# Patient Record
Sex: Female | Born: 1965 | Race: White | Hispanic: No | Smoking: Current every day smoker
Health system: Southern US, Community
[De-identification: ages and names within clinical notes are randomized; demographics above are authoritative.]

## PROBLEM LIST (undated history)

## (undated) DIAGNOSIS — Z5189 Encounter for other specified aftercare: Secondary | ICD-10-CM

## (undated) DIAGNOSIS — Z87440 Personal history of urinary (tract) infections: Secondary | ICD-10-CM

## (undated) DIAGNOSIS — Z8619 Personal history of other infectious and parasitic diseases: Secondary | ICD-10-CM

## (undated) DIAGNOSIS — K219 Gastro-esophageal reflux disease without esophagitis: Secondary | ICD-10-CM

## (undated) DIAGNOSIS — R51 Headache: Secondary | ICD-10-CM

## (undated) DIAGNOSIS — R519 Headache, unspecified: Secondary | ICD-10-CM

## (undated) DIAGNOSIS — I10 Essential (primary) hypertension: Secondary | ICD-10-CM

## (undated) HISTORY — DX: Personal history of urinary (tract) infections: Z87.440

## (undated) HISTORY — DX: Headache, unspecified: R51.9

## (undated) HISTORY — DX: Gastro-esophageal reflux disease without esophagitis: K21.9

## (undated) HISTORY — DX: Headache: R51

## (undated) HISTORY — DX: Encounter for other specified aftercare: Z51.89

## (undated) HISTORY — DX: Personal history of other infectious and parasitic diseases: Z86.19

---
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
## 1992-07-31 HISTORY — PX: CHOLECYSTECTOMY: SHX55

## 1998-12-07 ENCOUNTER — Other Ambulatory Visit: Admission: RE | Admit: 1998-12-07 | Discharge: 1998-12-07 | Payer: Self-pay | Admitting: Obstetrics & Gynecology

## 1999-01-03 ENCOUNTER — Encounter: Payer: Self-pay | Admitting: Internal Medicine

## 1999-01-03 ENCOUNTER — Ambulatory Visit (HOSPITAL_COMMUNITY): Admission: RE | Admit: 1999-01-03 | Discharge: 1999-01-03 | Payer: Self-pay | Admitting: Internal Medicine

## 2000-05-17 ENCOUNTER — Other Ambulatory Visit: Admission: RE | Admit: 2000-05-17 | Discharge: 2000-05-17 | Payer: Self-pay | Admitting: Obstetrics and Gynecology

## 2000-06-06 ENCOUNTER — Ambulatory Visit (HOSPITAL_COMMUNITY): Admission: RE | Admit: 2000-06-06 | Discharge: 2000-06-06 | Payer: Self-pay | Admitting: Gastroenterology

## 2000-11-20 ENCOUNTER — Encounter: Payer: Self-pay | Admitting: Gastroenterology

## 2000-11-20 ENCOUNTER — Encounter: Admission: RE | Admit: 2000-11-20 | Discharge: 2000-11-20 | Payer: Self-pay | Admitting: Gastroenterology

## 2000-12-13 ENCOUNTER — Encounter: Payer: Self-pay | Admitting: Obstetrics and Gynecology

## 2000-12-13 ENCOUNTER — Encounter (INDEPENDENT_AMBULATORY_CARE_PROVIDER_SITE_OTHER): Payer: Self-pay | Admitting: Specialist

## 2000-12-13 ENCOUNTER — Ambulatory Visit (HOSPITAL_COMMUNITY): Admission: RE | Admit: 2000-12-13 | Discharge: 2000-12-13 | Payer: Self-pay | Admitting: Obstetrics and Gynecology

## 2002-12-22 ENCOUNTER — Other Ambulatory Visit: Admission: RE | Admit: 2002-12-22 | Discharge: 2002-12-22 | Payer: Self-pay | Admitting: Obstetrics and Gynecology

## 2002-12-24 ENCOUNTER — Ambulatory Visit (HOSPITAL_COMMUNITY): Admission: RE | Admit: 2002-12-24 | Discharge: 2002-12-24 | Payer: Self-pay | Admitting: Obstetrics and Gynecology

## 2002-12-24 ENCOUNTER — Encounter: Payer: Self-pay | Admitting: Obstetrics and Gynecology

## 2004-08-05 ENCOUNTER — Ambulatory Visit: Payer: Self-pay | Admitting: Internal Medicine

## 2005-03-14 ENCOUNTER — Ambulatory Visit: Payer: Self-pay | Admitting: Internal Medicine

## 2006-01-09 ENCOUNTER — Ambulatory Visit: Payer: Self-pay | Admitting: Internal Medicine

## 2006-04-17 ENCOUNTER — Ambulatory Visit: Payer: Self-pay | Admitting: Internal Medicine

## 2006-05-29 ENCOUNTER — Ambulatory Visit: Payer: Self-pay | Admitting: Internal Medicine

## 2007-01-28 ENCOUNTER — Ambulatory Visit: Payer: Self-pay | Admitting: Internal Medicine

## 2007-01-28 LAB — CONVERTED CEMR LAB
ALT: 15 units/L (ref 0–35)
AST: 18 units/L (ref 0–37)
Albumin: 3.6 g/dL (ref 3.5–5.2)
Alkaline Phosphatase: 55 units/L (ref 39–117)
BUN: 11 mg/dL (ref 6–23)
Basophils Absolute: 0 10*3/uL (ref 0.0–0.1)
Basophils Relative: 0.4 % (ref 0.0–1.0)
Bilirubin, Direct: 0.1 mg/dL (ref 0.0–0.3)
CO2: 30 meq/L (ref 19–32)
Calcium: 9 mg/dL (ref 8.4–10.5)
Chloride: 108 meq/L (ref 96–112)
Cholesterol: 171 mg/dL (ref 0–200)
Creatinine, Ser: 0.6 mg/dL (ref 0.4–1.2)
Eosinophils Absolute: 0.2 10*3/uL (ref 0.0–0.6)
Eosinophils Relative: 2.3 % (ref 0.0–5.0)
GFR calc Af Amer: 140 mL/min
GFR calc non Af Amer: 116 mL/min
Glucose, Bld: 114 mg/dL — ABNORMAL HIGH (ref 70–99)
HCT: 34.2 % — ABNORMAL LOW (ref 36.0–46.0)
HDL: 25 mg/dL — ABNORMAL LOW (ref >39.0)
Hemoglobin: 11.1 g/dL — ABNORMAL LOW (ref 12.0–15.0)
LDL Cholesterol: 116 mg/dL — ABNORMAL HIGH (ref 0–99)
Lymphocytes Relative: 25.8 % (ref 12.0–46.0)
MCHC: 32.4 g/dL (ref 30.0–36.0)
MCV: 68.3 fL — ABNORMAL LOW (ref 78.0–100.0)
Monocytes Absolute: 0.6 10*3/uL (ref 0.2–0.7)
Monocytes Relative: 6.9 % (ref 3.0–11.0)
Neutro Abs: 6 10*3/uL (ref 1.4–7.7)
Neutrophils Relative %: 64.6 % (ref 43.0–77.0)
Platelets: 302 10*3/uL (ref 150–400)
Potassium: 4.2 meq/L (ref 3.5–5.1)
RBC: 5.01 M/uL (ref 3.87–5.11)
RDW: 18.1 % — ABNORMAL HIGH (ref 11.5–14.6)
Sodium: 142 meq/L (ref 135–145)
TSH: 2.28 microintl units/mL (ref 0.35–5.50)
Total Bilirubin: 0.5 mg/dL (ref 0.3–1.2)
Total CHOL/HDL Ratio: 6.8
Total Protein: 7.1 g/dL (ref 6.0–8.3)
Triglycerides: 148 mg/dL (ref 0–149)
VLDL: 30 mg/dL (ref 0–40)
WBC: 9.2 10*3/uL (ref 4.5–10.5)

## 2007-02-12 ENCOUNTER — Other Ambulatory Visit: Admission: RE | Admit: 2007-02-12 | Discharge: 2007-02-12 | Payer: Self-pay | Admitting: Internal Medicine

## 2007-02-12 ENCOUNTER — Ambulatory Visit: Payer: Self-pay | Admitting: Internal Medicine

## 2007-02-12 ENCOUNTER — Encounter: Payer: Self-pay | Admitting: Internal Medicine

## 2007-02-21 ENCOUNTER — Telehealth: Payer: Self-pay | Admitting: *Deleted

## 2007-03-27 ENCOUNTER — Encounter: Payer: Self-pay | Admitting: Internal Medicine

## 2007-04-11 DIAGNOSIS — R51 Headache: Secondary | ICD-10-CM

## 2007-04-11 DIAGNOSIS — K219 Gastro-esophageal reflux disease without esophagitis: Secondary | ICD-10-CM | POA: Insufficient documentation

## 2007-04-11 DIAGNOSIS — J309 Allergic rhinitis, unspecified: Secondary | ICD-10-CM | POA: Insufficient documentation

## 2007-04-11 DIAGNOSIS — R519 Headache, unspecified: Secondary | ICD-10-CM | POA: Insufficient documentation

## 2007-07-15 ENCOUNTER — Telehealth: Payer: Self-pay | Admitting: Internal Medicine

## 2007-07-16 ENCOUNTER — Telehealth: Payer: Self-pay | Admitting: Internal Medicine

## 2008-03-06 ENCOUNTER — Telehealth: Payer: Self-pay | Admitting: *Deleted

## 2008-04-16 ENCOUNTER — Ambulatory Visit: Payer: Self-pay | Admitting: Internal Medicine

## 2008-04-16 DIAGNOSIS — N809 Endometriosis, unspecified: Secondary | ICD-10-CM | POA: Insufficient documentation

## 2008-04-16 DIAGNOSIS — F17200 Nicotine dependence, unspecified, uncomplicated: Secondary | ICD-10-CM

## 2008-04-16 DIAGNOSIS — E282 Polycystic ovarian syndrome: Secondary | ICD-10-CM

## 2008-04-16 DIAGNOSIS — F4321 Adjustment disorder with depressed mood: Secondary | ICD-10-CM

## 2008-04-16 DIAGNOSIS — R5383 Other fatigue: Secondary | ICD-10-CM

## 2008-04-16 DIAGNOSIS — R55 Syncope and collapse: Secondary | ICD-10-CM

## 2008-04-16 DIAGNOSIS — R635 Abnormal weight gain: Secondary | ICD-10-CM

## 2008-04-16 DIAGNOSIS — R5381 Other malaise: Secondary | ICD-10-CM

## 2008-04-16 LAB — CONVERTED CEMR LAB: Vit D, 1,25-Dihydroxy: 10 — ABNORMAL LOW (ref 30–89)

## 2008-04-20 LAB — CONVERTED CEMR LAB
ALT: 35 units/L (ref 0–35)
AST: 35 units/L (ref 0–37)
Basophils Relative: 0.5 % (ref 0.0–3.0)
Bilirubin, Direct: 0.1 mg/dL (ref 0.0–0.3)
CO2: 28 meq/L (ref 19–32)
Calcium: 9.1 mg/dL (ref 8.4–10.5)
Creatinine, Ser: 0.6 mg/dL (ref 0.4–1.2)
Glucose, Bld: 100 mg/dL — ABNORMAL HIGH (ref 70–99)
Hemoglobin: 11.1 g/dL — ABNORMAL LOW (ref 12.0–15.0)
LDL Cholesterol: 125 mg/dL — ABNORMAL HIGH (ref 0–99)
Lymphocytes Relative: 27.7 % (ref 12.0–46.0)
Monocytes Relative: 5.7 % (ref 3.0–12.0)
Neutro Abs: 5.1 10*3/uL (ref 1.4–7.7)
Prolactin: 14.9 ng/mL
RBC: 4.63 M/uL (ref 3.87–5.11)
Sodium: 138 meq/L (ref 135–145)
Testosterone: 62.65 ng/dL (ref 10.00–70.0)
Total CHOL/HDL Ratio: 7.3
Total Protein: 7.2 g/dL (ref 6.0–8.3)

## 2008-04-21 ENCOUNTER — Encounter: Payer: Self-pay | Admitting: Internal Medicine

## 2008-05-05 LAB — CONVERTED CEMR LAB: Volume, Urine-CORTUR: 1000 mL

## 2008-05-08 ENCOUNTER — Telehealth: Payer: Self-pay | Admitting: Internal Medicine

## 2008-07-02 ENCOUNTER — Telehealth: Payer: Self-pay | Admitting: Internal Medicine

## 2008-08-12 ENCOUNTER — Ambulatory Visit: Payer: Self-pay | Admitting: Internal Medicine

## 2008-08-12 DIAGNOSIS — J069 Acute upper respiratory infection, unspecified: Secondary | ICD-10-CM | POA: Insufficient documentation

## 2008-08-12 DIAGNOSIS — E559 Vitamin D deficiency, unspecified: Secondary | ICD-10-CM | POA: Insufficient documentation

## 2008-08-12 DIAGNOSIS — F988 Other specified behavioral and emotional disorders with onset usually occurring in childhood and adolescence: Secondary | ICD-10-CM | POA: Insufficient documentation

## 2008-11-12 ENCOUNTER — Ambulatory Visit: Payer: Self-pay | Admitting: Internal Medicine

## 2008-11-12 DIAGNOSIS — M25569 Pain in unspecified knee: Secondary | ICD-10-CM

## 2008-12-04 ENCOUNTER — Ambulatory Visit: Payer: Self-pay | Admitting: Internal Medicine

## 2008-12-04 DIAGNOSIS — R509 Fever, unspecified: Secondary | ICD-10-CM

## 2008-12-04 DIAGNOSIS — R05 Cough: Secondary | ICD-10-CM | POA: Insufficient documentation

## 2008-12-04 LAB — CONVERTED CEMR LAB
Albumin: 3.9 g/dL (ref 3.5–5.2)
BUN: 11 mg/dL (ref 6–23)
Basophils Absolute: 0.1 10*3/uL (ref 0.0–0.1)
Calcium: 9.3 mg/dL (ref 8.4–10.5)
Creatinine, Ser: 0.7 mg/dL (ref 0.4–1.2)
Eosinophils Relative: 1.3 % (ref 0.0–5.0)
GFR calc non Af Amer: 96.07 mL/min (ref >60)
Lymphocytes Relative: 20.8 % (ref 12.0–46.0)
Mono Screen: NEGATIVE
Monocytes Relative: 6.9 % (ref 3.0–12.0)
Neutrophils Relative %: 70.5 % (ref 43.0–77.0)
Platelets: 347 10*3/uL (ref 150.0–400.0)
Potassium: 4.2 meq/L (ref 3.5–5.1)
RDW: 18 % — ABNORMAL HIGH (ref 11.5–14.6)
Total Protein: 7.9 g/dL (ref 6.0–8.3)
WBC: 10.9 10*3/uL — ABNORMAL HIGH (ref 4.5–10.5)

## 2008-12-21 ENCOUNTER — Ambulatory Visit: Payer: Self-pay | Admitting: Cardiovascular Disease

## 2008-12-21 ENCOUNTER — Ambulatory Visit: Payer: Self-pay | Admitting: Internal Medicine

## 2008-12-21 DIAGNOSIS — J329 Chronic sinusitis, unspecified: Secondary | ICD-10-CM | POA: Insufficient documentation

## 2008-12-21 DIAGNOSIS — H6500 Acute serous otitis media, unspecified ear: Secondary | ICD-10-CM

## 2008-12-25 ENCOUNTER — Encounter: Payer: Self-pay | Admitting: Internal Medicine

## 2009-02-15 ENCOUNTER — Telehealth: Payer: Self-pay | Admitting: *Deleted

## 2009-03-30 ENCOUNTER — Ambulatory Visit: Payer: Self-pay | Admitting: Internal Medicine

## 2009-03-30 DIAGNOSIS — R1031 Right lower quadrant pain: Secondary | ICD-10-CM

## 2009-03-30 DIAGNOSIS — F438 Other reactions to severe stress: Secondary | ICD-10-CM

## 2009-03-30 DIAGNOSIS — M542 Cervicalgia: Secondary | ICD-10-CM

## 2009-03-30 LAB — CONVERTED CEMR LAB
Hep B C IgM: NEGATIVE
Hep B S Ab: NEGATIVE

## 2009-03-31 ENCOUNTER — Telehealth: Payer: Self-pay | Admitting: Internal Medicine

## 2009-04-01 LAB — CONVERTED CEMR LAB
Basophils Relative: 0 % (ref 0.0–3.0)
Eosinophils Absolute: 0.1 10*3/uL (ref 0.0–0.7)
Eosinophils Relative: 1.7 % (ref 0.0–5.0)
Free T4: 0.8 ng/dL (ref 0.6–1.6)
HCT: 33.1 % — ABNORMAL LOW (ref 36.0–46.0)
Lymphs Abs: 2.3 10*3/uL (ref 0.7–4.0)
MCHC: 32.1 g/dL (ref 30.0–36.0)
MCV: 66.2 fL — ABNORMAL LOW (ref 78.0–100.0)
Monocytes Absolute: 0.2 10*3/uL (ref 0.1–1.0)
Neutrophils Relative %: 68.3 % (ref 43.0–77.0)
Platelets: 358 10*3/uL (ref 150.0–400.0)
RBC: 5 M/uL (ref 3.87–5.11)
TSH: 1.39 microintl units/mL (ref 0.35–5.50)
WBC: 8.2 10*3/uL (ref 4.5–10.5)

## 2009-04-07 ENCOUNTER — Encounter: Payer: Self-pay | Admitting: Internal Medicine

## 2009-05-05 ENCOUNTER — Telehealth: Payer: Self-pay | Admitting: *Deleted

## 2009-07-07 ENCOUNTER — Ambulatory Visit: Payer: Self-pay | Admitting: Internal Medicine

## 2009-07-07 DIAGNOSIS — T50995A Adverse effect of other drugs, medicaments and biological substances, initial encounter: Secondary | ICD-10-CM

## 2009-07-07 DIAGNOSIS — E785 Hyperlipidemia, unspecified: Secondary | ICD-10-CM

## 2009-07-07 DIAGNOSIS — R002 Palpitations: Secondary | ICD-10-CM

## 2009-07-07 DIAGNOSIS — D649 Anemia, unspecified: Secondary | ICD-10-CM | POA: Insufficient documentation

## 2009-07-07 DIAGNOSIS — G479 Sleep disorder, unspecified: Secondary | ICD-10-CM | POA: Insufficient documentation

## 2009-07-07 LAB — CONVERTED CEMR LAB
CO2: 24 meq/L (ref 19–32)
Chloride: 108 meq/L (ref 96–112)
Cholesterol: 148 mg/dL (ref 0–200)
Creatinine, Ser: 0.5 mg/dL (ref 0.4–1.2)
HDL: 29.4 mg/dL — ABNORMAL LOW (ref >39.00)
LDL Cholesterol: 96 mg/dL (ref 0–99)
Potassium: 4 meq/L (ref 3.5–5.1)
Tissue Transglutaminase Ab, IgA: 1.1 units (ref <7)
Total CHOL/HDL Ratio: 5
Triglycerides: 113 mg/dL (ref 0.0–149.0)
VLDL: 22.6 mg/dL (ref 0.0–40.0)

## 2009-07-13 LAB — CONVERTED CEMR LAB
Basophils Absolute: 0 10*3/uL (ref 0.0–0.1)
Eosinophils Absolute: 0.3 10*3/uL (ref 0.0–0.7)
Ferritin: 2.2 ng/mL — ABNORMAL LOW (ref 10.0–291.0)
Hemoglobin: 9.8 g/dL — ABNORMAL LOW (ref 12.0–15.0)
Lymphocytes Relative: 28.1 % (ref 12.0–46.0)
MCHC: 31.9 g/dL (ref 30.0–36.0)
Monocytes Relative: 7.5 % (ref 3.0–12.0)
Neutro Abs: 4.7 10*3/uL (ref 1.4–7.7)
Neutrophils Relative %: 60.3 % (ref 43.0–77.0)
Platelets: 249 10*3/uL (ref 150.0–400.0)
RDW: 18.5 % — ABNORMAL HIGH (ref 11.5–14.6)
Saturation Ratios: 3.4 % — ABNORMAL LOW (ref 20.0–50.0)

## 2009-12-09 ENCOUNTER — Telehealth: Payer: Self-pay | Admitting: Internal Medicine

## 2010-03-28 ENCOUNTER — Ambulatory Visit: Payer: Self-pay | Admitting: Internal Medicine

## 2010-03-28 DIAGNOSIS — R609 Edema, unspecified: Secondary | ICD-10-CM

## 2010-03-28 DIAGNOSIS — R030 Elevated blood-pressure reading, without diagnosis of hypertension: Secondary | ICD-10-CM

## 2010-03-29 ENCOUNTER — Telehealth: Payer: Self-pay | Admitting: *Deleted

## 2010-08-20 ENCOUNTER — Encounter: Payer: Self-pay | Admitting: Gastroenterology

## 2010-08-28 LAB — CONVERTED CEMR LAB
Albumin: 3.8 g/dL (ref 3.5–5.2)
BUN: 8 mg/dL (ref 6–23)
Basophils Absolute: 0 10*3/uL (ref 0.0–0.1)
CO2: 26 meq/L (ref 19–32)
Direct LDL: 130.5 mg/dL
Eosinophils Absolute: 0.1 10*3/uL (ref 0.0–0.7)
Free T4: 0.64 ng/dL (ref 0.60–1.60)
Glucose, Bld: 87 mg/dL (ref 70–99)
Glucose, Urine, Semiquant: NEGATIVE
HCT: 32.1 % — ABNORMAL LOW (ref 36.0–46.0)
Hemoglobin: 10 g/dL — ABNORMAL LOW (ref 12.0–15.0)
Ketones, urine, test strip: NEGATIVE
Lymphs Abs: 2.3 10*3/uL (ref 0.7–4.0)
MCHC: 31.3 g/dL (ref 30.0–36.0)
Monocytes Absolute: 0.5 10*3/uL (ref 0.1–1.0)
Neutro Abs: 5.4 10*3/uL (ref 1.4–7.7)
Nitrite: NEGATIVE
Potassium: 4.4 meq/L (ref 3.5–5.1)
RDW: 19.1 % — ABNORMAL HIGH (ref 11.5–14.6)
Sodium: 139 meq/L (ref 135–145)
Specific Gravity, Urine: 1.015
TSH: 1.05 microintl units/mL (ref 0.35–5.50)
Triglycerides: 207 mg/dL — ABNORMAL HIGH (ref 0.0–149.0)
WBC Urine, dipstick: NEGATIVE
pH: 5.5

## 2010-08-30 NOTE — Progress Notes (Signed)
Summary: Pt req refill of Ketorolac 10mg .   Phone Note Refill Request Call back at Home Phone [phone] Message from:  Patient on March 29, 2010 11:10 AM  Refills Requested: Medication #1:  KETOROLAC TROMETHAMINE 10 MG  TABS Take 2 at onset of headache then one every 4 to 6 hours as needed **max 4 per 24 hours**   Dosage confirmed as above?Dosage Confirmed Pt having severe migraine headaches. Pls refill the Ketorolac 10mg .     Method Requested: Telephone to [facility] [phone] Initial call taken by: Lucy Antigua,  March 29, 2010 11:10 AM  Follow-up for Phone Call        pt call back concerning status of rx Follow-up by: Heron Sabins,  March 29, 2010 4:09 PM  Additional Follow-up for Phone Call Additional follow up Details #1::        please refill her medicine x 2  Additional Follow-up by: Madelin Headings MD,  March 29, 2010 5:45 PM    New/Updated Medications: KETOROLAC TROMETHAMINE 10 MG  TABS (KETOROLAC TROMETHAMINE) Take 2 at onset of headache then one every 4 to 6 hours as needed **max 4 per 24 hours** Prescriptions: KETOROLAC TROMETHAMINE 10 MG  TABS (KETOROLAC TROMETHAMINE) Take 2 at onset of headache then one every 4 to 6 hours as needed **max 4 per 24 hours**  #20 x 1   Entered by:   Romualdo Bolk, CMA (AAMA)   Authorized by:   Madelin Headings MD   Signed by:   Romualdo Bolk, CMA (AAMA) on 03/29/2010   Method used:   Electronically to        [facility]  [address] (retail)       [address]       [place], [place]  [address]       Ph: [phone]       Fax: [phone]   RxID:   [id number]

## 2010-08-30 NOTE — Progress Notes (Signed)
Summary: rx for sinus  Phone Note Call from Patient   Caller: Patient Call For: Madelin Headings MD Reason for Call: Acute Illness Summary of Call: Pt is asking Dr. Fabian Sharp for an antibiotic to be called to [facility]) for sinus infection, c/o sinus headache, congestion (green mucus), cough, left ear pain, x 2 weeks.  No fever. [phone] Initial call taken by: Lynann Beaver CMA,  Dec 09, 2009 10:42 AM  Follow-up for Phone Call        Per Dr. Fabian Sharp- needs ov to evaluate. Can use SDA tomorrow. Follow-up by: Romualdo Bolk, CMA Duncan Dull),  Dec 09, 2009 11:27 AM  Additional Follow-up for Phone Call Additional follow up Details #1::        Pt. notified.  Did not schedule appt due to finances. Additional Follow-up by: Lynann Beaver CMA,  Dec 09, 2009 11:30 AM

## 2010-08-30 NOTE — Assessment & Plan Note (Signed)
Summary: follow up on meds/ssc   Vital Signs:  Patient profile:   44 year old female Menstrual status:  regular LMP:     03/03/2010 Height:      69 inches Weight:      275 pounds BMI:     40.76 Pulse rate:   88 / minute BP sitting:   150 / 80  (left arm) Cuff size:   large  Vitals Entered By: Romualdo Bolk, CMA (AAMA) (March 28, 2010 2:05 PM)  CC: Follow-up visit on meds, pt is also having problems with her legs swelling while traveling. This has happened twice before. She has also had a ha for 4 days. Pt is also still complaining of rt sided pain that has gotten worse with a burning sensation. LMP (date): 03/03/2010 LMP - Character: heavy Menarche (age onset years): 14   Menses interval (days): 28 Menstrual flow (days): 5 Enter LMP: 03/03/2010   History of Present Illness: Joy Cooley  comes in today  for follow up of labs and anemia    GERD  helping with  prilosec . Headaches : Getting has near cycle.    last refill  november # 40. denies se of  short doarse toradol  no gi bleeding  EDEMA:   noted left    leg swelling on train to [place]   and then when traveled to [place]   same thing.    NO pain redness or formness or chaning in ability to walk.   no cp some palpitations she has had in the past  Breathing "good so far.    " still smoking  not aware of OSA signs .  Still has burning pain rlq   ? if could be a cyst  has seen gyne for this and no dx.      does have a dx of  endometriosis  . discomfort not related to bowel function.  MOOD: doing much better .   Preventive Screening-Counseling & Management  Alcohol-Tobacco     Alcohol drinks/day: <1     Smoking Status: current     Smoking Cessation Counseling: yes     Smoke Cessation Stage: contemplative     Packs/Day: 1.0  Caffeine-Diet-Exercise     Caffeine use/day: 2     Does Patient Exercise: yes     Type of exercise: walking  Current Medications (verified): 1)  Flexeril 10 Mg Tabs (Cyclobenzaprine  Hcl) .... 1/2 To 1 Three Times A Day As Needed 2)  Prilosec 20 Mg Cpdr (Omeprazole) .Marland Kitchen.. 1 By Mouth Once Daily 3)  Ketorolac Tromethamine 10 Mg  Tabs (Ketorolac Tromethamine) .... Take 2 At Onset of Headache Then One Every 4 To 6 Hours As Needed **max 4 Per 24 Hours** 4)  Promethazine Hcl 25 Mg  Tabs (Promethazine Hcl) .Marland Kitchen.. 1 By Mouth Every 4-5 Hours As Needed 5)  Vitamin D 04540 Unit Caps (Ergocalciferol) .Marland Kitchen.. 1 By Mouth Every Other Week For 12 Weeks 6)  Vitamin B Complex-C   Caps (B Complex-C) 7)  Lunesta 3 Mg Tabs (Eszopiclone) .Marland Kitchen.. 1 By Mouth  Hs As Needed 8)  Niferex-Pn Forte  Tabs (Prenatal Vit-Fe Psac Cmplx-Fa) .Marland Kitchen.. 1 By Mouth Once Daily  Allergies (verified): 1)  ! Erythromycin 2)  Xanax  Past History:  Past medical, surgical, family and social histories (including risk factors) reviewed, and no changes noted (except as noted below).  Past Medical History: Reviewed history from 11/12/2008 and no changes required. Migraine HAs Endometriosis ? PCO  Allergic rhinitis GERD Tobacco Use Headache  eval 2006-2007  using toradol as needed    Consults Dr. Russella Dar     Past Surgical History: Reviewed history from 12/21/2008 and no changes required. Caesarean section Cholecystectomy  adenoids BTL       Past History:  Care Management: Gynecology: Dr. Senaida Ores Gastroenterology: Dr. Russella Dar  Family History: Reviewed history from 12/04/2008 and no changes required. ADOPTED Son has asthma   Social History: Reviewed history from 07/07/2009 and no changes required. Occupation: trained in Chief Financial Officer   working  separated in process of divorce Current Smoker Alcohol use-no  Drug use-no Regular exercise-no    sleep   6-8 hours   Review of Systems  The patient denies anorexia, fever, weight loss, vision loss, decreased hearing, chest pain, syncope, dyspnea on exertion, prolonged cough, melena, hematochezia, severe indigestion/heartburn, hematuria, muscle weakness, difficulty  walking, abnormal bleeding, enlarged lymph nodes, and angioedema.    Physical Exam  General:  alert, well-developed, well-nourished, and well-hydrated.   Head:  normocephalic and atraumatic.   Eyes:  vision grossly intact, pupils equal, and pupils round.   Ears:  R ear normal, L ear normal, and no external deformities.   Nose:  no external deformity and no nasal discharge.   Mouth:  pharynx pink and moist.   Neck:  No deformities, masses, or tenderness noted. Lungs:  Normal respiratory effort, chest expands symmetrically. Lungs are clear to auscultation, no crackles or wheezes.no dullness.   Heart:  Normal rate and regular rhythm. S1 and S2 normal without gallop, murmur, click, rub or other extra sounds.no lifts.   Abdomen:  Bowel sounds positive,abdomen soft and non-tender without masses, organomegaly or  noted.  points to area of right pelvic lower q as area where gets discomfort  Pulses:  pulses intact without delay   Extremities:  trace left pedal edema, 1+ left pedal edema, trace right pedal edema, and 1+ right pedal edema.   no redness or local swelling  tenderness Neurologic:  alert & oriented X3, strength normal in all extremities, and gait normal.  non f ocal  Skin:  turgor normal, color normal, no ecchymoses, and no petechiae.   Cervical Nodes:  No lymphadenopathy noted Psych:  Oriented X3, normally interactive, good eye contact, not anxious appearing, and not depressed appearing.     Impression & Recommendations:  Problem # 1:  EDEMA (ICD-782.3) no evidence of  sig cv compromise and could be  related to obestiy and gravity .  no obv obstructive signs otherwise. will get labs    Orders: UA Dipstick w/o Micro (automated)  (81003) Specimen Handling (62130) T-2 View CXR (71020TC) TLB-Lipid Panel (80061-LIPID) TLB-BMP (Basic Metabolic Panel-BMET) (80048-METABOL) TLB-CBC Platelet - w/Differential (85025-CBCD) TLB-Hepatic/Liver Function Pnl (80076-HEPATIC) TLB-TSH (Thyroid  Stimulating Hormone) (84443-TSH) TLB-T4 (Thyrox), Free (86578-IO9G) EKG w/ Interpretation (93000)  Problem # 2:  PALPITATIONS, RECURRENT (ICD-785.1) ekg nsr with ns st changes   Orders: UA Dipstick w/o Micro (automated)  (81003) Specimen Handling (29528) T-2 View CXR (71020TC) TLB-Lipid Panel (80061-LIPID) TLB-BMP (Basic Metabolic Panel-BMET) (80048-METABOL) TLB-CBC Platelet - w/Differential (85025-CBCD) TLB-Hepatic/Liver Function Pnl (80076-HEPATIC) TLB-TSH (Thyroid Stimulating Hormone) (84443-TSH) TLB-T4 (Thyrox), Free [phone]) EKG w/ Interpretation (93000)  Problem # 3:  UNSPECIFIED ANEMIA (ICD-285.9)  repeat labs today   Hgb: 9.8 (07/07/2009)   Hct: 30.8 (07/07/2009)   Platelets: 249.0 (07/07/2009) RBC: 4.62 (07/07/2009)   RDW: 18.5 H % (07/07/2009)   WBC: 7.8 (07/07/2009) MCV: 66.5 L fl (07/07/2009)   MCHC: 31.9 (07/07/2009) Ferritin: 2.2 (07/07/2009) Iron:  17 (07/07/2009)   % Sat: 3.4 (07/07/2009) B12: 246 (07/07/2009)   Folate: 3.0 (07/07/2009)   TSH: 1.39 (03/30/2009)  Problem # 4:  TOBACCO USE (ICD-305.1)  Orders: T-2 View CXR (71020TC)  Problem # 5:  OBESITY, UNSPECIFIED (ICD-278.00)  Problem # 6:  GERD (ICD-530.81)  Her updated medication list for this problem includes:    Prilosec 20 Mg Cpdr (Omeprazole) .Marland Kitchen... 1 by mouth once daily  Problem # 7:  ADJUSTMENT DISORDER WITH DEPRESSED MOOD (ICD-309.0) Assessment: Improved  Problem # 8:  HEADACHE (ICD-784.0) seem homonally related .  caution with gi se poss of med.  Her updated medication list for this problem includes:    Ketorolac Tromethamine 10 Mg Tabs (Ketorolac tromethamine) .Marland Kitchen... Take 2 at onset of headache then one every 4 to 6 hours as needed **max 4 per 24 hours**  Problem # 9:  ELEVATED BLOOD PRESSURE WITHOUT DIAGNOSIS OF HYPERTENSION (ICD-796.2) patient attributes this to other factors today  may need to be on meds for control   should monitor and follow up   BP today: 150/80 Prior BP:  110/70 (07/07/2009)  Labs Reviewed: Creat: 0.5 (07/07/2009) Chol: 148 (07/07/2009)   HDL: 29.40 (07/07/2009)   LDL: 96 (07/07/2009)   TG: 113.0 (07/07/2009)  Instructed in low sodium diet (DASH Handout) and behavior modification.    Complete Medication List: 1)  Flexeril 10 Mg Tabs (Cyclobenzaprine hcl) .... 1/2 to 1 three times a day as needed 2)  Prilosec 20 Mg Cpdr (Omeprazole) .Marland Kitchen.. 1 by mouth once daily 3)  Ketorolac Tromethamine 10 Mg Tabs (Ketorolac tromethamine) .... Take 2 at onset of headache then one every 4 to 6 hours as needed **max 4 per 24 hours** 4)  Promethazine Hcl 25 Mg Tabs (Promethazine hcl) .Marland Kitchen.. 1 by mouth every 4-5 hours as needed 5)  Vitamin D 40981 Unit Caps (Ergocalciferol) .Marland Kitchen.. 1 by mouth every other week for 12 weeks 6)  Vitamin B Complex-c Caps (B complex-c) 7)  Lunesta 3 Mg Tabs (Eszopiclone) .Marland Kitchen.. 1 by mouth  hs as needed 8)  Niferex-pn Forte Tabs (Prenatal vit-fe psac cmplx-fa) .Marland Kitchen.. 1 by mouth once daily  Patient Instructions: 1)  You will be informed of lab /x ray results when available.  2)  follow up depending on results  3)  REC dc tobacco  4)  avoid salts and processed foods  5)  increase exercise  lose weight  may help . 6)  Check your  Blood Pressure regularly . If it is above: 140/90    you should make an appointment. to check this  Prescriptions: FLEXERIL 10 MG TABS (CYCLOBENZAPRINE HCL) 1/2 to 1 three times a day as needed  #90 x 2   Entered by:   Romualdo Bolk, CMA (AAMA)   Authorized by:   Madelin Headings MD   Signed by:   Romualdo Bolk, CMA (AAMA) on 03/28/2010   Method used:   Electronically to        [facility]  [address] (retail)       [address]       [place], [place]  [address]       Ph: [phone]       Fax: [phone]   RxID:   [id number]   Laboratory Results   Urine Tests  Date/Time Recieved: March 28, 2010 4:35 PM  Date/Time Reported: March 28, 2010 4:35 PM   Routine Urinalysis  Color: yellow Appearance: Clear Glucose: negative   (Normal Range: Negative) Bilirubin: negative   (Normal Range: Negative) Ketone: negative   (Normal Range: Negative) Spec. Gravity: 1.015   (Normal Range: 1.003-1.035) Blood: negative   (Normal Range: Negative) pH: 5.5   (Normal Range: 5.0-8.0) Protein: negative   (Normal Range: Negative) Urobilinogen: 0.2   (Normal Range: 0-1) Nitrite: negative   (Normal Range: Negative) Leukocyte Esterace: negative   (Normal Range: Negative)    Comments: Wynona Canes, CMA  March 28, 2010 4:35 PM

## 2010-12-16 NOTE — Op Note (Signed)
[facility]  Patient:    Joy Cooley, Joy Cooley                 MRN: [id number] Proc. Date: 12/13/00 Adm. Date:  [id number] Attending:  Oliver Pila                           Operative Report  PREOPERATIVE DIAGNOSES: 1. Pelvic pain. 2. Desires sterility. 3. History of endometriosis.  POSTOPERATIVE DIAGNOSES:  Left peritubal cyst as well as previous preoperative diagnoses listed.  PROCEDURE:  Diagnostic laparoscopy with partial left salpingectomy and removal of peritubal cyst.  Also right fulguration of fallopian tube.  SURGEON:  Alvino Chapel, M.D.  ANESTHESIA:  General.  ESTIMATED BLOOD LOSS:  Minimal.  URINE OUTPUT:  200 cc.  INTRAVENOUS FLUIDS:  Approximately 1400 cc.  FINDINGS:  There is a 3-4 cm fallopian tube cyst on the left, normal right ovary and tube, normal uterus, and there is no evidence of endometriosis. There was some scarring of the mesentery at the umbilicus.  To pathology, the distal left fallopian tube and cyst was sent.  DESCRIPTION OF PROCEDURE:  The patient was taken to the operating room where general anesthesia was obtained without difficulty.  She was then prepped and draped in the normal sterile fashion in the dorsal lithotomy position.  A speculum was then placed within the vagina and the cervix identified and grasped with a single-tooth tenaculum.  This was easily sounded, and a ZUMI syringe passed within for uterine manipulation.  After the balloon was inflated, the speculum was removed, and attention was then turned to the patients abdomen.  A small infraumbilical incision was then made with a scalpel, and the Veress needle was introduced into the abdomen.  It was quite difficult to get the Veress needle through the fascia, and it appeared that there was some scar tissue underlying.  This was accomplished after several attempts, and glass flow was applied with normal pressure noted  and pneumoperitoneum was obtained with approximately 3.5 liters of CO2 gas.  The Veress needle was then removed, and the trocar was placed within the incision. Intraperitoneal placement was then confirmed with the camera.  At this point, the glass flow was applied to the trocar, and the pelvis and abdomen were inspected with findings as previously stated.  At this point, it was noted that there was some mesentery adherent to the area of the umbilicus; however, this appeared to be more superior in location, and there was no evidence of bowel loops within this adhesion.  Inferiorly, the pelvis was clear, and there was no obstruction of view from the mesentery or bowel.  The right fallopian tube and ovary were carefully inspected and found to be normal.  The anterior and posterior cul-de-sac were both inspected and found to be normal with no evidence of endometriosis.  The patients left ovary and fallopian tube were carefully inspected.  The ovary appeared normal; however, the fallopian tube was notable enlarged with a 3-4 cm distal peritubal cyst.  An additional 5 mm trocar was placed in the patients left lower quadrant under direct visualization.  Again, it was slightly difficult to puncture through the peritoneal tissue; however, this was accomplished with no evidence of bleeding.  A blunt probe was then placed and the bowel swept away from the left adnexa.  An endoloop was then passed through this 5 mm trocar, and the operating scope utilized to grasp the distal end  of the fallopian tube and pull it through a 0 Vicryl endoloop.  This was cinched down just proximal to the peritubal cyst.  After this endoloop was trimmed away, a second endoloop was passed around the pedicle in a similar fashion.  The endoshears were then utilized to trim away the cyst, and the pedicle was inspected and found to be hemostatic.  The cyst itself was removed through the 10 mm trocar at the umbilicus and handed  off to pathology.  Again, the pedicle was inspected and found to be hemostatic; therefore, the Klepinger was introduced through the 5 mm port, and the right fallopian tube was fulgurated in three segments for a proximal 2-3 cm blanch of tube.  This was accomplished without difficulty, and again the pelvis and abdomen were inspected.  The 5 mm trocar site was inspected and found to be hemostatic.  There was no evidence of bleeding from the 11 mm trocar site at the umbilicus; however, this was slightly more difficult to visualize, as the camera was through this trocar.  Trocars and instruments were then removed from the abdomen, and the umbilicus was noted to have some bleeding.  This was palpated and appeared to be coming from the superfascial area.  After a deep suture was placed in the subcutaneous tissue, there was no further bleeding noted; therefore, the skin was closed with 3-0 Vicryl.  Sponge, lap, and needle counts were correct x 2, and the patient was then taken to the recovery room in stable condition. DD:  12/13/00 TD:  12/14/00 Job: [id number]

## 2010-12-16 NOTE — H&P (Signed)
[facility]  Patient:    Joy Cooley, Joy Cooley                   MRN: [id number] Adm. Date:  12/13/00 Attending:  Alvino Chapel, M.D.                         History and Physical  HISTORY OF PRESENT ILLNESS:  The patient is a 45 year old, G-2, P-2, who presents for a diagnostic laparoscopy and laparoscopic tubal sterilization secondary to some persistent pelvic pain and a history of endometriosis.  The patient started noting these symptoms approximately six months ago and they began increasing specifically in the right quadrant approximately one month ago.  She has had a workup from gastroenterology which is negative and a CT scan which was essentially negative, except for a small cyst on the left ovary.  Currently, the patient is on no contraceptive method, however, has recently had a menstrual cycle and will have a pregnancy test prior to procedure.  PAST MEDICAL HISTORY:  Significant for reflux disease and hiatal hernia.  PAST SURGICAL HISTORY:  She had a cholecystectomy in 1994.  She had a laparoscopy in 1990 associated with her abdominal and pelvic pain which revealed endometriosis.  That was performed in [place], [place].  ALLERGIES:  ERYTHROMYCIN.  CURRENT MEDICATIONS:  Nexium and Reglan.  PAST GYNECOLOGICAL HISTORY:  Significant for the history of endometriosis and some question of polycystic ovary disease.  The patient has had a normal Pap smear performed in October 2001.  PAST OBSTETRICAL HISTORY:  Significant for a cesarean section of an 8 pound 2 ounce breech infant in 1994, as well as in 1996 a normal spontaneous vaginal delivery of a 7 pound 15 ounce infant.  PHYSICAL EXAMINATION:  VITAL SIGNS:  The patient is 5 feet 10 inches in height.  Weight 230 pounds.  HEART:  Regular rate and rhythm.  LUNGS:  Clear.  ABDOMEN:  Soft and nontender.  BREASTS:  Normal, with no masses, discharge, or adenopathy noted.  PELVIC:  She  has normal external genitalia.  Cervix is without lesions. Uterus is normal size.  Adnexa without masses.  LABORATORY DATA:  Hemoglobin 15.1.  DISCUSSION:  The patient was counseled as to the risks and benefits of procedure and understands these issues, however, she wishes to proceed with a diagnostic laparoscopy for her pelvic pain with possible ablation of endometriosis as well as a laparoscopic tubal sterilization given a desire for permanent sterility.  The patient has failed previous medical therapy with Lupron and currently is not a candidate for birth control pills given her smoking status.  PLAN:  We will proceed with the diagnostic laparoscopy and sterilization as planned, with ablation of any endometriosis discovered at the time of surgery. D:  12/10/00 TD:  12/10/00 Job: [id number]

## 2010-12-16 NOTE — Cardiovascular Report (Signed)
[place]  Patient:    Joy Cooley, Joy Cooley                 MRN: [id number] Proc. Date: 06/06/00 Adm. Date:  [id number] Attending:  Charna Elizabeth CC:         Neta Mends. Panosh, M.D. [facility]                        Cardiac Catheterization  DATE OF BIRTH:  1965/10/04  REFERRING PHYSICIAN:  Neta Mends. Panosh, M.D. [facility]  PROCEDURE PERFORMED:  Esophagogastroduodenoscopy.  ENDOSCOPIST:  Anselmo Rod, M.D.  INSTRUMENT USED:  Olympus video panendoscope.  INDICATIONS FOR PROCEDURE: Abdominal pain with guaiac positive stool.  Rule out peptic ulcer disease, esophagitis, gastritis, etc.  PREPROCEDURE PREPARATION:  Informed consent was procured from the patient. The patient was fasted for eight hours prior to the procedure.  PREPROCEDURE PHYSICAL:  The patient had stable vital signs.  Neck supple. Chest clear to auscultation.  S1, S2 regular.  Abdomen soft with normal abdominal bowel sounds.  DESCRIPTION OF PROCEDURE:  The patient was placed in left lateral decubitus position and sedated with 50 mg of Demerol and 7 mg of Versed intravenously. Once the patient was adequately sedated and maintained on low-flow oxygen and continuous cardiac monitoring, the Olympus video panendoscope was advanced through the mouthpiece, over the tongue, into the esophagus under direct vision.  The entire esophagus appeared normal without evidence of ring stricture, masses, lesions, esophagitis or Barretts mucosa.  The scope was then advanced into the stomach.  The entire gastric mucosa appeared healthy without lesions.  The proximal small bowel appeared normal.  There was no outlet obstruction.  A small hiatal hernia was seen on high retroflexion.  IMPRESSION:  Normal esophagogastroduodenoscopy except for a small hiatal hernia.  RECOMMENDATION:  Proceed with colonoscopy at this time. DD:  06/06/00 TD:  06/07/00 Job: [id number]

## 2010-12-16 NOTE — Procedures (Signed)
[place]  Patient:    Joy Cooley, Joy Cooley                 MRN: [id number] Proc. Date: 06/06/00 Adm. Date:  [id number] Attending:  Charna Elizabeth CC:         Neta Mends. Panosh, M.D. [facility]   Procedure Report  DATE OF BIRTH:  [birth year]  PROCEDURE PERFORMED:  Colonoscopy.  ENDOSCOPIST:  Anselmo Rod, M.D.  INSTRUMENT USED:  Olympus video colonoscope.  INDICATION FOR PROCEDURE:  Guaiac positive stool and abdominal pain in a 45 year old white female, rule out colonic polyps, masses, hemorrhoids, etc.  PREPROCEDURE PREPARATION:  Informed consent was procured from the patient. The patient was fasted for eight hours prior to the procedure and prepped with a bottle of magnesium citrate and a gallon of NuLytely the night prior to the procedure.  PREPROCEDURE PHYSICAL:  VITAL SIGNS:  The patient had stable vital signs.  NECK:  Supple.  CHEST:  Clear to auscultation.  S1 and S2 regular.  No murmur, rub or gallop, rales, rhonchi or wheezing.  ABDOMEN: Soft with normal abdominal bowel sounds.  DESCRIPTION OF PROCEDURE:  The patient was placed in the left lateral decubitus position and sedated with an additional 50 mg of Demerol and 3 mg of Versed intravenously.  Once the patient was adequately sedated and maintained on low flow oxygen and continuous cardiac monitoring, the Olympus video colonoscope was advanced from the rectum to the cecum without difficulty.  The patient had some abdominal discomfort.  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  No erosions, ulcerations, masses or polyps were seen.  The colon appeared healthy up to the cecum and terminal ileum.  IMPRESSION:  Normal colonoscopy.  RECOMMENDATIONS: 1. The patient has been advised to increase the fluid and fiber in her diet. 2. Outpatient follow up is advised in the next two weeks. DD:  06/06/00 TD:  06/07/00 Job:  [id number]

## 2011-02-24 ENCOUNTER — Telehealth: Payer: Self-pay | Admitting: Internal Medicine

## 2011-02-24 NOTE — Telephone Encounter (Signed)
Spoke with patient.

## 2011-02-24 NOTE — Telephone Encounter (Signed)
Pt needs to know date of last tetanus shot asap today. Pt need this for job today.

## 2011-04-19 ENCOUNTER — Other Ambulatory Visit: Payer: Self-pay | Admitting: Internal Medicine

## 2011-04-19 NOTE — Telephone Encounter (Signed)
Per Dr. Fabian Sharp- call pt and see why she is using it and how often she is taking it. Plan a rov.

## 2011-04-19 NOTE — Telephone Encounter (Signed)
LOV 03/28/10 NOV none Please advise

## 2011-04-20 NOTE — Telephone Encounter (Signed)
Left message for pt to call back  °

## 2011-04-24 NOTE — Telephone Encounter (Signed)
Spoke to pt and she had to change md's due to her ins not covering [place]. She had them refill her medication. I have denied the rx from [place].

## 2011-10-02 ENCOUNTER — Other Ambulatory Visit: Payer: Self-pay | Admitting: Internal Medicine

## 2011-10-03 NOTE — Telephone Encounter (Signed)
done

## 2011-10-06 ENCOUNTER — Telehealth: Payer: Self-pay | Admitting: Internal Medicine

## 2014-07-15 HISTORY — PX: TOTAL ABDOMINAL HYSTERECTOMY W/ BILATERAL SALPINGOOPHORECTOMY: SHX83

## 2014-07-27 ENCOUNTER — Encounter: Payer: Self-pay | Admitting: Family Medicine

## 2014-08-19 ENCOUNTER — Encounter: Payer: Self-pay | Admitting: Internal Medicine

## 2014-08-19 ENCOUNTER — Ambulatory Visit (INDEPENDENT_AMBULATORY_CARE_PROVIDER_SITE_OTHER): Payer: BLUE CROSS/BLUE SHIELD | Admitting: Internal Medicine

## 2014-08-19 VITALS — BP 146/96 | Temp 98.1°F | Ht 68.5 in | Wt 266.7 lb

## 2014-08-19 DIAGNOSIS — R030 Elevated blood-pressure reading, without diagnosis of hypertension: Secondary | ICD-10-CM

## 2014-08-19 DIAGNOSIS — M62838 Other muscle spasm: Secondary | ICD-10-CM

## 2014-08-19 DIAGNOSIS — K219 Gastro-esophageal reflux disease without esophagitis: Secondary | ICD-10-CM

## 2014-08-19 DIAGNOSIS — M6248 Contracture of muscle, other site: Secondary | ICD-10-CM

## 2014-08-19 DIAGNOSIS — F17200 Nicotine dependence, unspecified, uncomplicated: Secondary | ICD-10-CM

## 2014-08-19 DIAGNOSIS — Z720 Tobacco use: Secondary | ICD-10-CM

## 2014-08-19 MED ORDER — CYCLOBENZAPRINE HCL 5 MG PO TABS: 5.0000 mg | ORAL_TABLET | Freq: Three times a day (TID) | ORAL | Status: DC | PRN

## 2014-08-19 NOTE — Progress Notes (Signed)
Pre visit review using our clinic review tool, if applicable. No additional management support is needed unless otherwise documented below in the visit note.  Chief Complaint  Patient presents with  . Establish Care    gerd neck spasm    HPI: Patient  Joy Cooley  49 y.o. comes in today for new patient reestablish  visit   She is a 49 year old smoking divorced white female who is now working for [employer] was recently had a hysterectomy and fallopian tube removal for adenomyosis and abnormal cells. She had laparoscopic procedure and then had and had open procedure after bleeding and maintain requiring a transfusion. This was in December 2015 Dr. Adah Perl in [place]. She's doing well now has a little discharge from umbilicus area but no fever. She was placed on antibiotics at some point.  scaitica is a problem  Was on  Med hormone suppression  For  bpone pain may have helped or harm needed.  Constant pain in neck. That caused  Migraines.   Worse after  Computer  use but she looks at her workstation does her exercises but does ask for some Flexeril that she uses as needed to prevent her migraines from occurring  bp is usually 125   hctz using as needed. Was used for as needed swelling in the past.  She's been taking omeprazole has a history of esophageal narrowing thinks it may be reoccurring sometimes pills will get caught in the mid upper chest but no vomiting or weight loss. She has seen Dr. Fuller Plan. In the past.  Health Maintenance  Topic Date Due  . COLONOSCOPY  09/06/2013  . INFLUENZA VACCINE  03/01/2015  . MAMMOGRAM  04/30/2016  . PAP SMEAR  05/31/2017  . TETANUS/TDAP  04/11/2018   Health Maintenance Review LIFESTYLE:  Exercise:  Not yet  Tobacco/ETS:  Decreasing   Ha stopped in past  Alcohol: gave up. Sugar beverages:coke per day.  Sleep:  At least 8  Drug use: no Bone density:  Colonoscopy:  Over 10 year  PAP: had hysterectomy  adn salpingectomy  adenomyosis and abnormal cells  MAMMO: utd    ROS:  GEN/ HEENT: No fever, significant weight changes sweats headaches vision problems hearing changes, CV/ PULM; No chest pain shortness of breath cough, syncope,edema  change in exercise tolerance. GI /GU: No adominal pain, vomiting, change in bowel habits. No blood in the stool. No significant GU symptoms. SKIN/HEME: ,no acute skin rashes suspicious lesions or bleeding. No lymphadenopathy, nodules, masses.  NEURO/ PSYCH:  No neurologic signs such as weakness numbness. No depression anxiety. IMM/ Allergy: No unusual infections.  Allergy .   REST of 12 system review negative except as per HPI   Past Medical History  Diagnosis Date  . Hx of varicella   . GERD (gastroesophageal reflux disease)   . Blood transfusion without reported diagnosis     from hysterectomy surgery  . Hx: UTI (urinary tract infection)   . Recurrent headache     neck can trigger this has done pt /flexeril    Past Surgical History  Procedure Laterality Date  . Total abdominal hysterectomy w/ bilateral salpingoophorectomy  07/15/14    has ovaries   . Cholecystectomy  1994  . Tonsillectomy  1967    Family History  Problem Relation Age of Onset  . Adopted: Yes    History   Social History  . Marital Status: Single    Spouse Name: N/A    Number of  Children: N/A  . Years of Education: N/A   Social History Main Topics  . Smoking status: Current Every Day Smoker  . Smokeless tobacco: Never Used  . Alcohol Use: No  . Drug Use: No  . Sexual Activity:    Partners: Male   Other Topics Concern  . None   Social History Narrative   8 hours of sleep per night   Lives with your boyfriend, son and her daughter when she is home from school.   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  omeprazole (PRILOSEC) 20 MG capsule Take 20 mg by mouth daily.  . cyclobenzaprine (FLEXERIL) 5 MG tablet Take 1 tablet (5 mg total) by mouth 3 (three) times daily as needed for muscle spasms. neck  . hydrochlorothiazide (HYDRODIURIL) 25 MG tablet     EXAM:  BP 146/96 mmHg  Temp(Src) 98.1 F (36.7 C) (Oral)  Ht 5' 8.5" (1.74 m)  Wt 266 lb 11.2 oz (120.974 kg)  BMI 39.96 kg/m2  Body mass index is 39.96 kg/(m^2).  Physical Exam: Vital signs reviewed WUJ:WJXB is a well-developed well-nourished alert cooperative    who appearsr stated age in no acute distress.  HEENT: normocephalic atraumatic , Eyes: PERRL EOM's full, conjunctiva clear, Nares: paten,t no deformity discharge or tenderness., Ears: no deformity EAC's clear TMs with normal landmarks. Mouth: clear OP, no lesions, edema.  Moist mucous membranes. Dentition in adequate repair. NECK: supple without masses, thyromegaly or bruits. CHEST/PULM:  Clear to auscultation and percussion breath sounds equal no wheeze , rales or rhonchi. No chest wall deformities or tenderness. CV: PMI is nondisplaced, S1 S2 no gallops, murmurs, rubs. Peripheral pulses are full without delay.No JVD .  ABDOMEN: Bowel sounds normal nontender  No guard or rebound, no hepato splenomegal no CVA tenderness.  Healed umbilicus scar no acute discharge healing low transverse scar some induration around the right but no abscess streaking or specific tenderness. Extremtities:  No clubbing cyanosis or edema, no acute joint swelling or redness no focal atrophy NEURO:  Oriented x3, cranial nerves 3-12 appear to be intact, no obvious focal weakness,gait within normal limits  SKIN: No acute rashes normal turgor, color, no bruising or petechiae. PSYCH: Oriented, good eye contact, no obvious depression anxiety, cognition and judgment appear normal. LN: no cervicaladenopathy    ASSESSMENT AND PLAN:  Discussed the following assessment and plan:  Neck muscle spasm - do  exercise ocass med with caution  Gastroesophageal reflux disease, esophagitis presence not specified - Plan: Ambulatory referral to Gastroenterology  Elevated blood pressure reading - Record and treat as appropriate patient states normal at home recently.  TOBACCO USE - advised cessation.  Patient Care Team: Burnis Medin, MD as PCP - General (Internal Medicine) Nat Math, MD as Referring Physician (Obstetrics and Gynecology) Patient Instructions   Will to  referral to dr Fuller Plan  .    About the  Esophagus . And colon screening.  Continue   Neck exercises  As per PT  Flexeril as needed with caution.  Continue stopping tobacco .    Healthy lifestyle includes : At least 150 minutes of exercise weeks  , weight at healthy levels, which is  usually   BMI 19-25. Avoid trans fats and processed foods;  Increase fresh fruits and veges to 5 servings per day. And avoid sweet beverages including tea and juice. Mediterranean diet with olive oil and nuts have been noted to be heart and brain healthy . Avoid tobacco products . Limit  alcohol to  7 per week for women and 14 servings for men.  Get adequate sleep . Wear seat belts . Don't text and drive .   Take blood pressure readings twice a day for 7- 10 days and then periodically .To ensure below 140/90   .Send in readings    Or return if elevated      Why follow it? Research shows. . Those who follow the Mediterranean diet have a reduced risk of heart disease  . The diet is associated with a reduced incidence of Parkinson's and Alzheimer's diseases . People following the diet may have longer life expectancies and lower rates of chronic diseases  . The Dietary Guidelines for Americans recommends the Mediterranean diet as an eating plan to promote health and prevent disease  What Is the Mediterranean Diet?  . Healthy eating plan based on typical foods and recipes of Mediterranean-style cooking . The diet is primarily a plant based diet;  these foods should make up a majority of meals   Starches - Plant based foods should make up a majority of meals - They are an important sources of vitamins, minerals, energy, antioxidants, and fiber - Choose whole grains, foods high in fiber and minimally processed items  - Typical grain sources include wheat, oats, barley, corn, brown rice, bulgar, farro, millet, polenta, couscous  - Various types of beans include chickpeas, lentils, fava beans, black beans, white beans   Fruits  Veggies - Large quantities of antioxidant rich fruits & veggies; 6 or more servings  - Vegetables can be eaten raw or lightly drizzled with oil and cooked  - Vegetables common to the traditional Mediterranean Diet include: artichokes, arugula, beets, broccoli, brussel sprouts, cabbage, carrots, celery, collard greens, cucumbers, eggplant, kale, leeks, lemons, lettuce, mushrooms, okra, onions, peas, peppers, potatoes, pumpkin, radishes, rutabaga, shallots, spinach, sweet potatoes, turnips, zucchini - Fruits common to the Mediterranean Diet include: apples, apricots, avocados, cherries, clementines, dates, figs, grapefruits, grapes, melons, nectarines, oranges, peaches, pears, pomegranates, strawberries, tangerines  Fats - Replace butter and margarine with healthy oils, such as olive oil, canola oil, and tahini  - Limit nuts to no more than a handful a day  - Nuts include walnuts, almonds, pecans, pistachios, pine nuts  - Limit or avoid candied, honey roasted or heavily salted nuts - Olives are central to the [employer] - can be eaten whole or used in a variety of dishes   Meats Protein - Limiting red meat: no more than a few times a month - When eating red meat: choose lean cuts and keep the portion to the size of deck of cards - Eggs: approx. 0 to 4 times a week  - Fish and lean poultry: at least 2 a week  - Healthy protein sources include, chicken, [place], lean beef, lamb - Increase intake of seafood such as  tuna, salmon, trout, mackerel, shrimp, scallops - Avoid or limit high fat processed meats such as sausage and bacon  Dairy - Include moderate amounts of low fat dairy products  - Focus on healthy dairy such as fat free yogurt, skim milk, low or reduced fat cheese - Limit dairy products higher in fat such as  whole or 2% milk, cheese, ice cream  Alcohol - Moderate amounts of red wine is ok  - No more than 5 oz daily for women (all ages) and men older than age 25  - No more than 10 oz of wine daily for men younger than 16  Other - Limit sweets and other desserts  - Use herbs and spices instead of salt to flavor foods  - Herbs and spices common to the traditional Mediterranean Diet include: basil, bay leaves, chives, cloves, cumin, fennel, garlic, lavender, marjoram, mint, oregano, parsley, pepper, rosemary, sage, savory, sumac, tarragon, thyme   It's not just a diet, it's a lifestyle:  . The Mediterranean diet includes lifestyle factors typical of those in the region  . Foods, drinks and meals are best eaten with others and savored . Daily physical activity is important for overall good health . This could be strenuous exercise like running and aerobics . This could also be more leisurely activities such as walking, housework, yard-work, or taking the stairs . Moderation is the key; a balanced and healthy diet accommodates most foods and drinks . Consider portion sizes and frequency of consumption of certain foods   Meal Ideas & Options:  . Breakfast:  o Whole wheat toast or whole wheat English muffins with peanut butter & hard boiled egg o Steel cut oats topped with apples & cinnamon and skim milk  o Fresh fruit: banana, strawberries, melon, berries, peaches  o Smoothies: strawberries, bananas, greek yogurt, peanut butter o Low fat greek yogurt with blueberries and granola  o Egg white omelet with spinach and mushrooms o Breakfast couscous: whole wheat couscous, apricots, skim milk,  cranberries  . Sandwiches:  o Hummus and grilled vegetables (peppers, zucchini, squash) on whole wheat bread   o Grilled chicken on whole wheat pita with lettuce, tomatoes, cucumbers or tzatziki  o Tuna salad on whole wheat bread: tuna salad made with greek yogurt, olives, red peppers, capers, green onions o Garlic rosemary lamb pita: lamb sauted with garlic, rosemary, salt & pepper; add lettuce, cucumber, greek yogurt to pita - flavor with lemon juice and black pepper  . Seafood:  o Mediterranean grilled salmon, seasoned with garlic, basil, parsley, lemon juice and black pepper o Shrimp, lemon, and spinach whole-grain pasta salad made with low fat greek yogurt  o Seared scallops with lemon orzo  o Seared tuna steaks seasoned salt, pepper, coriander topped with tomato mixture of olives, tomatoes, olive oil, minced garlic, parsley, green onions and cappers  . Meats:  o Herbed greek chicken salad with kalamata olives, cucumber, feta  o Red bell peppers stuffed with spinach, bulgur, lean ground beef (or lentils) & topped with feta   o Kebabs: skewers of chicken, tomatoes, onions, zucchini, squash  o [place] burgers: made with red onions, mint, dill, lemon juice, feta cheese topped with roasted red peppers . Vegetarian o Cucumber salad: cucumbers, artichoke hearts, celery, red onion, feta cheese, tossed in olive oil & lemon juice  o Hummus and whole grain pita points with a greek salad (lettuce, tomato, feta, olives, cucumbers, red onion) o Lentil soup with celery, carrots made with vegetable broth, garlic, salt and pepper  o Tabouli salad: parsley, bulgur, mint, scallions, cucumbers, tomato, radishes, lemon juice, olive oil, salt and pepper.            Standley Brooking. Panosh M.D.

## 2014-08-19 NOTE — Patient Instructions (Addendum)
Will to  referral to dr Fuller Plan  .    About the  Esophagus . And colon screening.  Continue   Neck exercises  As per PT  Flexeril as needed with caution.  Continue stopping tobacco .    Healthy lifestyle includes : At least 150 minutes of exercise weeks  , weight at healthy levels, which is usually   BMI 19-25. Avoid trans fats and processed foods;  Increase fresh fruits and veges to 5 servings per day. And avoid sweet beverages including tea and juice. Mediterranean diet with olive oil and nuts have been noted to be heart and brain healthy . Avoid tobacco products . Limit  alcohol to  7 per week for women and 14 servings for men.  Get adequate sleep . Wear seat belts . Don't text and drive .   Take blood pressure readings twice a day for 7- 10 days and then periodically .To ensure below 140/90   .Send in readings    Or return if elevated      Why follow it? Research shows. . Those who follow the Mediterranean diet have a reduced risk of heart disease  . The diet is associated with a reduced incidence of Parkinson's and Alzheimer's diseases . People following the diet may have longer life expectancies and lower rates of chronic diseases  . The Dietary Guidelines for Americans recommends the Mediterranean diet as an eating plan to promote health and prevent disease  What Is the Mediterranean Diet?  . Healthy eating plan based on typical foods and recipes of Mediterranean-style cooking . The diet is primarily a plant based diet; these foods should make up a majority of meals   Starches - Plant based foods should make up a majority of meals - They are an important sources of vitamins, minerals, energy, antioxidants, and fiber - Choose whole grains, foods high in fiber and minimally processed items  - Typical grain sources include wheat, oats, barley, corn, brown rice, bulgar, farro, millet, polenta, couscous  - Various types of beans include chickpeas, lentils, fava beans, black beans,  white beans   Fruits  Veggies - Large quantities of antioxidant rich fruits & veggies; 6 or more servings  - Vegetables can be eaten raw or lightly drizzled with oil and cooked  - Vegetables common to the traditional Mediterranean Diet include: artichokes, arugula, beets, broccoli, brussel sprouts, cabbage, carrots, celery, collard greens, cucumbers, eggplant, kale, leeks, lemons, lettuce, mushrooms, okra, onions, peas, peppers, potatoes, pumpkin, radishes, rutabaga, shallots, spinach, sweet potatoes, turnips, zucchini - Fruits common to the Mediterranean Diet include: apples, apricots, avocados, cherries, clementines, dates, figs, grapefruits, grapes, melons, nectarines, oranges, peaches, pears, pomegranates, strawberries, tangerines  Fats - Replace butter and margarine with healthy oils, such as olive oil, canola oil, and tahini  - Limit nuts to no more than a handful a day  - Nuts include walnuts, almonds, pecans, pistachios, pine nuts  - Limit or avoid candied, honey roasted or heavily salted nuts - Olives are central to the [employer] - can be eaten whole or used in a variety of dishes   Meats Protein - Limiting red meat: no more than a few times a month - When eating red meat: choose lean cuts and keep the portion to the size of deck of cards - Eggs: approx. 0 to 4 times a week  - Fish and lean poultry: at least 2 a week  - Healthy protein sources include, chicken, [place], lean beef, lamb - Increase  intake of seafood such as tuna, salmon, trout, mackerel, shrimp, scallops - Avoid or limit high fat processed meats such as sausage and bacon  Dairy - Include moderate amounts of low fat dairy products  - Focus on healthy dairy such as fat free yogurt, skim milk, low or reduced fat cheese - Limit dairy products higher in fat such as whole or 2% milk, cheese, ice cream  Alcohol - Moderate amounts of red wine is ok  - No more than 5 oz daily for women (all ages) and men older than age 82   - No more than 10 oz of wine daily for men younger than 61  Other - Limit sweets and other desserts  - Use herbs and spices instead of salt to flavor foods  - Herbs and spices common to the traditional Mediterranean Diet include: basil, bay leaves, chives, cloves, cumin, fennel, garlic, lavender, marjoram, mint, oregano, parsley, pepper, rosemary, sage, savory, sumac, tarragon, thyme   It's not just a diet, it's a lifestyle:  . The Mediterranean diet includes lifestyle factors typical of those in the region  . Foods, drinks and meals are best eaten with others and savored . Daily physical activity is important for overall good health . This could be strenuous exercise like running and aerobics . This could also be more leisurely activities such as walking, housework, yard-work, or taking the stairs . Moderation is the key; a balanced and healthy diet accommodates most foods and drinks . Consider portion sizes and frequency of consumption of certain foods   Meal Ideas & Options:  . Breakfast:  o Whole wheat toast or whole wheat English muffins with peanut butter & hard boiled egg o Steel cut oats topped with apples & cinnamon and skim milk  o Fresh fruit: banana, strawberries, melon, berries, peaches  o Smoothies: strawberries, bananas, greek yogurt, peanut butter o Low fat greek yogurt with blueberries and granola  o Egg white omelet with spinach and mushrooms o Breakfast couscous: whole wheat couscous, apricots, skim milk, cranberries  . Sandwiches:  o Hummus and grilled vegetables (peppers, zucchini, squash) on whole wheat bread   o Grilled chicken on whole wheat pita with lettuce, tomatoes, cucumbers or tzatziki  o Tuna salad on whole wheat bread: tuna salad made with greek yogurt, olives, red peppers, capers, green onions o Garlic rosemary lamb pita: lamb sauted with garlic, rosemary, salt & pepper; add lettuce, cucumber, greek yogurt to pita - flavor with lemon juice and black  pepper  . Seafood:  o Mediterranean grilled salmon, seasoned with garlic, basil, parsley, lemon juice and black pepper o Shrimp, lemon, and spinach whole-grain pasta salad made with low fat greek yogurt  o Seared scallops with lemon orzo  o Seared tuna steaks seasoned salt, pepper, coriander topped with tomato mixture of olives, tomatoes, olive oil, minced garlic, parsley, green onions and cappers  . Meats:  o Herbed greek chicken salad with kalamata olives, cucumber, feta  o Red bell peppers stuffed with spinach, bulgur, lean ground beef (or lentils) & topped with feta   o Kebabs: skewers of chicken, tomatoes, onions, zucchini, squash  o [place] burgers: made with red onions, mint, dill, lemon juice, feta cheese topped with roasted red peppers . Vegetarian o Cucumber salad: cucumbers, artichoke hearts, celery, red onion, feta cheese, tossed in olive oil & lemon juice  o Hummus and whole grain pita points with a greek salad (lettuce, tomato, feta, olives, cucumbers, red onion) o Lentil soup with celery, carrots  made with vegetable broth, garlic, salt and pepper  o Tabouli salad: parsley, bulgur, mint, scallions, cucumbers, tomato, radishes, lemon juice, olive oil, salt and pepper.

## 2014-08-20 ENCOUNTER — Telehealth: Payer: Self-pay | Admitting: Internal Medicine

## 2014-08-20 NOTE — Telephone Encounter (Signed)
emmi emailed °

## 2014-08-25 ENCOUNTER — Telehealth: Payer: Self-pay | Admitting: Internal Medicine

## 2014-08-25 NOTE — Telephone Encounter (Signed)
Patient states Dr. Regis Bill wanted her to monitor her BP reading.  They are as follows:  08/24/14-  182/112 and pm 158/92   08/25/14 - 201/130

## 2014-08-26 NOTE — Telephone Encounter (Signed)
Begin lisinopril hctz  20/12.5  1 po qd  Disp 30 refill x 3  Keep getting readings in the interim   OV in 3-4 weeks with monitor to check

## 2014-08-26 NOTE — Telephone Encounter (Signed)
Left a message on home number for the pt to return my call.  No voicemail on cell.

## 2014-08-27 MED ORDER — LISINOPRIL-HYDROCHLOROTHIAZIDE 20-12.5 MG PO TABS: 1.0000 | ORAL_TABLET | Freq: Every day | ORAL | Status: DC

## 2014-08-27 NOTE — Telephone Encounter (Signed)
Called and spoke to the pt.  Informed her of new rx.  She has made a future appt for 09/21/14.  Instructed her to stop taking hctz pill because it will be combined with new rx.

## 2014-09-21 ENCOUNTER — Encounter: Payer: BLUE CROSS/BLUE SHIELD | Admitting: Internal Medicine

## 2014-09-21 NOTE — Progress Notes (Signed)
Document opened and reviewed for  visit . No showed . Needs to make appt fu for her BP monitoring in the next 2 months  Should have a wellness visit in 3-8 months

## 2014-09-25 ENCOUNTER — Telehealth: Payer: Self-pay | Admitting: Family Medicine

## 2014-09-25 NOTE — Telephone Encounter (Signed)
Spoke to the pt and she is now scheduled for 10/06/14 @ 4 PM She needed that time due to her work schedule.  Document opened and reviewed for visit . No showed . Needs to make appt fu for her BP monitoring in the next 2 months  Should have a wellness visit in 3-8 months

## 2014-10-06 ENCOUNTER — Encounter: Payer: Self-pay | Admitting: Internal Medicine

## 2014-10-06 ENCOUNTER — Encounter: Payer: Self-pay | Admitting: Family Medicine

## 2014-10-06 ENCOUNTER — Ambulatory Visit (INDEPENDENT_AMBULATORY_CARE_PROVIDER_SITE_OTHER): Payer: BLUE CROSS/BLUE SHIELD | Admitting: Internal Medicine

## 2014-10-06 VITALS — BP 120/80 | Temp 98.1°F | Ht 68.5 in | Wt 267.7 lb

## 2014-10-06 DIAGNOSIS — I10 Essential (primary) hypertension: Secondary | ICD-10-CM

## 2014-10-06 DIAGNOSIS — M25561 Pain in right knee: Secondary | ICD-10-CM

## 2014-10-06 DIAGNOSIS — K219 Gastro-esophageal reflux disease without esophagitis: Secondary | ICD-10-CM

## 2014-10-06 NOTE — Patient Instructions (Signed)
Will do ortho referral . Stay on the BP med doing well.   Will notify you  of labs when available. If ok then yearly wellness visit with labs .

## 2014-10-06 NOTE — Progress Notes (Signed)
Pre visit review using our clinic review tool, if applicable. No additional management support is needed unless otherwise documented below in the visit note.  Chief Complaint  Patient presents with  . Follow-up  . Hypertension  . Knee Pain    HPI: Joy Cooley  comes in today for follow up of  multiple medical problems.  Since her last visit she has been taking her antihypertensive medication every day had some mild fatigue but got over it blood pressure 102-128 range feels fine  GERD annoying but controlled. Right Knee killing her Right knee  remote hx of fall   ocass advil aleve.  Becoming more difficult with pain waking her in the middle the night also flares up her sciatica because she has adapted her motion. Promoted   And had to be promoted. Because of job changes some increased stress but still feels confident. Feels a lot less fatigue.  ROS: See pertinent positives and negatives per HPI.  Past Medical History  Diagnosis Date  . Hx of varicella   . GERD (gastroesophageal reflux disease)   . Blood transfusion without reported diagnosis     from hysterectomy surgery  . Hx: UTI (urinary tract infection)   . Recurrent headache     neck can trigger this has done pt /flexeril    Family History  Problem Relation Age of Onset  . Adopted: Yes    History   Social History  . Marital Status: Divorced    Spouse Name: N/A  . Number of Children: N/A  . Years of Education: N/A   Social History Main Topics  . Smoking status: Current Every Day Smoker  . Smokeless tobacco: Never Used  . Alcohol Use: No  . Drug Use: No  . Sexual Activity:    Partners: Male   Other Topics Concern  . None   Social History Narrative   8 hours of sleep per night   Lives with your boyfriend, son and her daughter when she is home from school.   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 cyclobenzaprine (FLEXERIL) 5 MG tablet Take 1 tablet (5 mg total) by mouth 3 (three) times daily as needed for muscle spasms. neck  . hydrochlorothiazide (HYDRODIURIL) 25 MG tablet   . lisinopril-hydrochlorothiazide (PRINZIDE,ZESTORETIC) 20-12.5 MG per tablet Take 1 tablet by mouth daily.  Marland Kitchen omeprazole (PRILOSEC) 20 MG capsule Take 20 mg by mouth daily.    EXAM:  BP 120/80 mmHg  Temp(Src) 98.1 F (36.7 C) (Oral)  Ht 5' 8.5" (1.74 m)  Wt 267 lb 11.2 oz (121.428 kg)  BMI 40.11 kg/m2  Body mass index is 40.11 kg/(m^2).  GENERAL: vitals reviewed and listed above, alert, oriented, appears well hydrated and in no acute distress HEENT: atraumatic, conjunctiva  clear, no obvious abnormalities on inspection of external nose and earsMS: moves all extremities without noticeable focal  abnormality sitting uncomfortably right knee extended mildly antalgic gait no acute trauma redness or warmth describes pain at the anterior medial knee PSYCH: pleasant and cooperative, no obvious depression or anxiety   ASSESSMENT AND PLAN:  Discussed the following assessment and plan:  Essential hypertension - Plan: Basic metabolic panel  Gastroesophageal  reflux disease, esophagitis presence not specified  Right knee pain Get chemistry today if okay and controlled can check her on a yearly basis we'll refer for knee pain that is progressive and awaking her at night possibly related to an old injury. She also has some sciatica that gets aggravated. Continue attempts at heart healthy lifestyle. -Patient advised to return or notify health care team  if symptoms worsen ,persist or new concerns arise.  Patient Instructions  Will do ortho referral . Stay on the BP med doing well.   Will notify you  of labs when available. If ok then yearly wellness visit with labs .    Standley Brooking. Joy Cooley M.D.

## 2014-10-07 ENCOUNTER — Telehealth: Payer: Self-pay | Admitting: Internal Medicine

## 2014-10-07 LAB — BASIC METABOLIC PANEL
BUN: 10 mg/dL (ref 6–23)
CO2: 27 meq/L (ref 19–32)
CREATININE: 0.58 mg/dL (ref 0.40–1.20)
Calcium: 9.6 mg/dL (ref 8.4–10.5)
Chloride: 103 mEq/L (ref 96–112)
GFR: 116.45 mL/min (ref >60.00)
Glucose, Bld: 89 mg/dL (ref 70–99)
Potassium: 4.6 mEq/L (ref 3.5–5.1)
SODIUM: 137 meq/L (ref 135–145)

## 2014-10-07 NOTE — Telephone Encounter (Signed)
emmi emailed °

## 2014-11-24 ENCOUNTER — Other Ambulatory Visit: Payer: Self-pay | Admitting: Internal Medicine

## 2014-12-24 ENCOUNTER — Other Ambulatory Visit: Payer: Self-pay | Admitting: Internal Medicine

## 2014-12-24 NOTE — Telephone Encounter (Signed)
Sent to the pharmacy by e-scribe. 

## 2014-12-29 ENCOUNTER — Other Ambulatory Visit: Payer: Self-pay | Admitting: Internal Medicine

## 2015-01-29 ENCOUNTER — Other Ambulatory Visit: Payer: Self-pay | Admitting: Internal Medicine

## 2015-01-29 NOTE — Telephone Encounter (Signed)
Sent to the pharmacy by e-scribe. 

## 2015-01-29 NOTE — Telephone Encounter (Signed)
Ok x 1

## 2015-02-26 ENCOUNTER — Other Ambulatory Visit: Payer: Self-pay | Admitting: Internal Medicine

## 2015-02-26 NOTE — Telephone Encounter (Signed)
Sent to the pharmacy by e-scribe. 

## 2015-02-26 NOTE — Telephone Encounter (Signed)
Ok x 1

## 2015-04-21 ENCOUNTER — Encounter: Payer: Self-pay | Admitting: Family Medicine

## 2015-04-21 ENCOUNTER — Ambulatory Visit (INDEPENDENT_AMBULATORY_CARE_PROVIDER_SITE_OTHER): Payer: BLUE CROSS/BLUE SHIELD | Admitting: Internal Medicine

## 2015-04-21 ENCOUNTER — Encounter: Payer: Self-pay | Admitting: Internal Medicine

## 2015-04-21 VITALS — BP 122/78 | Temp 98.6°F | Wt 285.1 lb

## 2015-04-21 DIAGNOSIS — R635 Abnormal weight gain: Secondary | ICD-10-CM

## 2015-04-21 DIAGNOSIS — R609 Edema, unspecified: Secondary | ICD-10-CM

## 2015-04-21 DIAGNOSIS — Z720 Tobacco use: Secondary | ICD-10-CM

## 2015-04-21 DIAGNOSIS — N951 Menopausal and female climacteric states: Secondary | ICD-10-CM

## 2015-04-21 DIAGNOSIS — Z23 Encounter for immunization: Secondary | ICD-10-CM | POA: Diagnosis not present

## 2015-04-21 DIAGNOSIS — F17200 Nicotine dependence, unspecified, uncomplicated: Secondary | ICD-10-CM

## 2015-04-21 DIAGNOSIS — I10 Essential (primary) hypertension: Secondary | ICD-10-CM

## 2015-04-21 DIAGNOSIS — Z79899 Other long term (current) drug therapy: Secondary | ICD-10-CM

## 2015-04-21 LAB — CBC WITH DIFFERENTIAL/PLATELET
BASOS ABS: 0 10*3/uL (ref 0.0–0.1)
Basophils Relative: 0.5 % (ref 0.0–3.0)
Eosinophils Absolute: 0.1 10*3/uL (ref 0.0–0.7)
Eosinophils Relative: 1.7 % (ref 0.0–5.0)
HCT: 39.7 % (ref 36.0–46.0)
HEMOGLOBIN: 12.9 g/dL (ref 12.0–15.0)
LYMPHS ABS: 2.8 10*3/uL (ref 0.7–4.0)
Lymphocytes Relative: 31 % (ref 12.0–46.0)
MCHC: 32.6 g/dL (ref 30.0–36.0)
MCV: 80.7 fl (ref 78.0–100.0)
MONO ABS: 0.5 10*3/uL (ref 0.1–1.0)
MONOS PCT: 5.7 % (ref 3.0–12.0)
NEUTROS PCT: 61.1 % (ref 43.0–77.0)
Neutro Abs: 5.4 10*3/uL (ref 1.4–7.7)
Platelets: 285 10*3/uL (ref 150.0–400.0)
RBC: 4.92 Mil/uL (ref 3.87–5.11)
RDW: 16.4 % — ABNORMAL HIGH (ref 11.5–15.5)
WBC: 8.9 10*3/uL (ref 4.0–10.5)

## 2015-04-21 LAB — HEPATIC FUNCTION PANEL
ALK PHOS: 63 U/L (ref 39–117)
ALT: 21 U/L (ref 0–35)
AST: 22 U/L (ref 0–37)
Albumin: 4 g/dL (ref 3.5–5.2)
BILIRUBIN TOTAL: 0.3 mg/dL (ref 0.2–1.2)
Bilirubin, Direct: 0.1 mg/dL (ref 0.0–0.3)
Total Protein: 7 g/dL (ref 6.0–8.3)

## 2015-04-21 LAB — POCT URINALYSIS DIPSTICK
Bilirubin, UA: NEGATIVE
Blood, UA: NEGATIVE
GLUCOSE UA: NEGATIVE
Ketones, UA: NEGATIVE
LEUKOCYTES UA: NEGATIVE
NITRITE UA: NEGATIVE
Protein, UA: NEGATIVE
Spec Grav, UA: 1.02
UROBILINOGEN UA: 0.2
pH, UA: 7

## 2015-04-21 LAB — BASIC METABOLIC PANEL
BUN: 9 mg/dL (ref 6–23)
CALCIUM: 9 mg/dL (ref 8.4–10.5)
CO2: 28 mEq/L (ref 19–32)
Chloride: 103 mEq/L (ref 96–112)
Creatinine, Ser: 0.53 mg/dL (ref 0.40–1.20)
GFR: 128.94 mL/min (ref >60.00)
GLUCOSE: 90 mg/dL (ref 70–99)
POTASSIUM: 3.9 meq/L (ref 3.5–5.1)
SODIUM: 137 meq/L (ref 135–145)

## 2015-04-21 LAB — T4, FREE: Free T4: 0.74 ng/dL (ref 0.60–1.60)

## 2015-04-21 LAB — TSH: TSH: 1.36 u[IU]/mL (ref 0.35–4.50)

## 2015-04-21 MED ORDER — VARENICLINE TARTRATE 0.5 MG X 11 & 1 MG X 42 PO MISC: ORAL | Status: DC

## 2015-04-21 MED ORDER — CYCLOBENZAPRINE HCL 5 MG PO TABS: 5.0000 mg | ORAL_TABLET | Freq: Three times a day (TID) | ORAL | Status: DC | PRN

## 2015-04-21 MED ORDER — LISINOPRIL-HYDROCHLOROTHIAZIDE 20-12.5 MG PO TABS: 1.0000 | ORAL_TABLET | Freq: Every day | ORAL | Status: DC

## 2015-04-21 NOTE — Patient Instructions (Signed)
  Wt Readings from Last 3 Encounters:  04/21/15 285 lb 1.6 oz (129.321 kg)  10/06/14 267 lb 11.2 oz (121.428 kg)  08/19/14 266 lb 11.2 oz (120.974 kg)  Work on weight loss. Ok to try chantix .Marland Kitchen Nasal cortisone  every day   For nasal allergies    flonase and  nasacort.  Considier  Ortho  Or sports medicine for back and knee  Exercising  and activity   program . Diuretics  dont fix the problem but can   Helps acutely .   Labs today . To check for kidney problems  Etc .  Low salt diet . elvated compression stockings  .

## 2015-04-21 NOTE — Progress Notes (Signed)
Pre visit review using our clinic review tool, if applicable. No additional management support is needed unless otherwise documented below in the visit note.  Chief Complaint  Patient presents with  . Stress  . Swelling in legs, ankles and hands  . Seasonal Allergies    HPI: Patient Joy Cooley  comes in today for SDA for   problem evaluation. Has a few issues  C/O Water rentention  Over summer     Worse with  Heat.    . Gained 19 pounds .   Job stress  Promoted and eating out.  A lot   Sits at desk for work   Sleep ok no osa  Hot flushes  At time  Had hystectomy still has ovaries  Seasonal allergies .   ruhimrrhea  Using otc afrin  As needed  BP is good . No polyuruia  Had had  normal BGs  Asks for refill flexeril as helps her has as needed  ROS: See pertinent positives and negatives per HPI.  Past Medical History  Diagnosis Date  . Hx of varicella   . GERD (gastroesophageal reflux disease)   . Blood transfusion without reported diagnosis     from hysterectomy surgery  . Hx: UTI (urinary tract infection)   . Recurrent headache     neck can trigger this has done pt /flexeril    Family History  Problem Relation Age of Onset  . Adopted: Yes    Social History   Social History  . Marital Status: Divorced    Spouse Name: N/A  . Number of Children: N/A  . Years of Education: N/A   Social History Main Topics  . Smoking status: Current Every Day Smoker  . Smokeless tobacco: Never Used  . Alcohol Use: No  . Drug Use: No  . Sexual Activity:    Partners: Male   Other Topics Concern  . None   Social History Narrative   8 hours of sleep per night   Lives with your boyfriend, son and her daughter when she is home from school.   Works full time at [employer]    Divorced    hh of 4    g2 p2    Less ppd    Now working  Time Environmental education officer job             Outpatient Prescriptions Prior to Visit  Medication Sig Dispense Refill  .  omeprazole (PRILOSEC) 20 MG capsule Take 20 mg by mouth daily.    . cyclobenzaprine (FLEXERIL) 5 MG tablet TAKE ONE TABLET 3 TIMES A DAY AS NEEDED.FOR MUSCLE SPASMS 30 tablet 0  . lisinopril-hydrochlorothiazide (PRINZIDE,ZESTORETIC) 20-12.5 MG per tablet TAKE ONE (1) TABLET EACH DAY 30 tablet 5  . hydrochlorothiazide (HYDRODIURIL) 25 MG tablet      No facility-administered medications prior to visit.     EXAM:  BP 122/78 mmHg  Temp(Src) 98.6 F (37 C) (Oral)  Wt 285 lb 1.6 oz (129.321 kg)  Body mass index is 42.71 kg/(m^2).  GENERAL: vitals reviewed and listed above, alert, oriented, appears well hydrated and in no acute distress ocass coughwheezy  HEENT: atraumatic, conjunctiva  clear, no obvious abnormalities on inspection of external nose and ears OP : no lesion edema or exudate  NECK: no obvious masses on inspection palpation  No jvd  LUNGS: clear to auscultation bilaterally, no wheezes, rales or rhonchi,  CV: HRRR, no clubbing cyanosis or   nl cap refill  1-2+ edema   Puffy hands  Abdomen:  Sof,t normal bowel sounds without hepatosplenomegaly, no guarding rebound or masses no CVA tendernessMS: moves all extremities without noticeable focal  abnormality PSYCH: pleasant and cooperative, no obvious depression or anxiety BP Readings from Last 3 Encounters:  04/21/15 122/78  10/06/14 120/80  08/19/14 146/96   Wt Readings from Last 3 Encounters:  04/21/15 285 lb 1.6 oz (129.321 kg)  10/06/14 267 lb 11.2 oz (121.428 kg)  08/19/14 266 lb 11.2 oz (120.974 kg)   Lab Results  Component Value Date   WBC 8.9 04/21/2015   HGB 12.9 04/21/2015   HCT 39.7 04/21/2015   PLT 285.0 04/21/2015   GLUCOSE 90 04/21/2015   CHOL 173 03/28/2010   TRIG 207.0* 03/28/2010   HDL 29.70* 03/28/2010   LDLDIRECT 130.5 03/28/2010   LDLCALC 96 07/07/2009   ALT 21 04/21/2015   AST 22 04/21/2015   NA 137 04/21/2015   K 3.9 04/21/2015   CL 103 04/21/2015   CREATININE 0.53 04/21/2015   BUN 9  04/21/2015   CO2 28 04/21/2015   TSH 1.36 04/21/2015   HGBA1C 6.0 04/16/2008     ASSESSMENT AND PLAN:  Discussed the following assessment and plan:  Edema - Plan: Basic metabolic panel, CBC with Differential/Platelet, Hepatic function panel, TSH, T4, free, POCT urinalysis dipstick  Weight gain - Plan: Basic metabolic panel, CBC with Differential/Platelet, Hepatic function panel, TSH, T4, free, POCT urinalysis dipstick  Tobacco use disorder - Plan: Basic metabolic panel, CBC with Differential/Platelet, Hepatic function panel, TSH, T4, free, POCT urinalysis dipstick  Need for prophylactic vaccination and inoculation against influenza - Plan: Flu Vaccine QUAD 36+ mos PF IM (Fluarix & Fluzone Quad PF)  Essential hypertension  Medication management Weight gain porb contribute to swelling plus pos perimenopausal  Diet  And sitting job.  Gets sciatica  Advise an exercise physical interventions  Not pain meds for this problem  pta ware. Ht good  Stay on same med     If all lab normal can use extra hctz 2-3 days to see if helps but not rxing underlying problem  Disc tobacco cessation  chantix patches etc  Disc stress reduction  meds wont help lifestyle  Alterations exerciseetc  -Patient advised to return or notify health care team  if symptoms worsen ,persist or new concerns arise.  Patient Instructions    Wt Readings from Last 3 Encounters:  04/21/15 285 lb 1.6 oz (129.321 kg)  10/06/14 267 lb 11.2 oz (121.428 kg)  08/19/14 266 lb 11.2 oz (120.974 kg)  Work on weight loss. Ok to try chantix .Marland Kitchen Nasal cortisone  every day   For nasal allergies    flonase and  nasacort.  Considier  Ortho  Or sports medicine for back and knee  Exercising  and activity   program . Diuretics  dont fix the problem but can   Helps acutely .   Labs today . To check for kidney problems  Etc .  Low salt diet . elvated compression stockings  .       Standley Brooking. Panosh M.D.

## 2015-07-15 ENCOUNTER — Other Ambulatory Visit: Payer: Self-pay | Admitting: Internal Medicine

## 2015-07-16 NOTE — Telephone Encounter (Signed)
Sent to the pharmacy by e-scribe.  Flexeril last filled on 04/21/15 #30 with 1 refill.  Filled for #30 today.  Last note states using PRN.  Lisinopril-hctz filled for 6 months.  Labs done in Sept 2016 were normal.  When should pt return for follow up?

## 2015-07-26 NOTE — Telephone Encounter (Signed)
Can fill for 6 months  ROV before  Runs out of meds

## 2015-09-11 ENCOUNTER — Other Ambulatory Visit: Payer: Self-pay | Admitting: Internal Medicine

## 2015-09-14 NOTE — Telephone Encounter (Signed)
Refill x1 

## 2015-09-15 NOTE — Telephone Encounter (Signed)
Sent to the pharmacy by e-scribe. 

## 2016-02-11 ENCOUNTER — Other Ambulatory Visit: Payer: Self-pay | Admitting: Internal Medicine

## 2016-02-11 NOTE — Telephone Encounter (Signed)
DENIED.  SENT TO [facility] ON 04/21/15 FOR 1 YEAR

## 2016-02-23 ENCOUNTER — Telehealth: Payer: Self-pay | Admitting: Family Medicine

## 2016-02-23 ENCOUNTER — Other Ambulatory Visit: Payer: Self-pay | Admitting: Family Medicine

## 2016-02-23 DIAGNOSIS — Z0000 Encounter for general adult medical examination without abnormal findings: Secondary | ICD-10-CM

## 2016-02-23 NOTE — Telephone Encounter (Signed)
Pt has been sch

## 2016-02-23 NOTE — Telephone Encounter (Signed)
Pt is due for cpx and lab work in Sept 17.  I have placed the lab orders.  Please help her to make both appointments.  Thanks!!

## 2016-04-20 LAB — HM MAMMOGRAPHY

## 2016-04-25 ENCOUNTER — Encounter: Payer: Self-pay | Admitting: Family Medicine

## 2016-04-26 ENCOUNTER — Other Ambulatory Visit (INDEPENDENT_AMBULATORY_CARE_PROVIDER_SITE_OTHER): Payer: Managed Care, Other (non HMO)

## 2016-04-26 DIAGNOSIS — Z0000 Encounter for general adult medical examination without abnormal findings: Secondary | ICD-10-CM | POA: Diagnosis not present

## 2016-04-26 LAB — CBC WITH DIFFERENTIAL/PLATELET
Basophils Absolute: 0 10*3/uL (ref 0.0–0.1)
Basophils Relative: 0.5 % (ref 0.0–3.0)
Eosinophils Absolute: 0.1 10*3/uL (ref 0.0–0.7)
Eosinophils Relative: 1.7 % (ref 0.0–5.0)
HCT: 41 % (ref 36.0–46.0)
Hemoglobin: 13.9 g/dL (ref 12.0–15.0)
Lymphocytes Relative: 33.1 % (ref 12.0–46.0)
Lymphs Abs: 2.7 10*3/uL (ref 0.7–4.0)
MCHC: 33.9 g/dL (ref 30.0–36.0)
MCV: 82.4 fl (ref 78.0–100.0)
Monocytes Absolute: 0.4 10*3/uL (ref 0.1–1.0)
Monocytes Relative: 5.4 % (ref 3.0–12.0)
Neutro Abs: 4.8 10*3/uL (ref 1.4–7.7)
Neutrophils Relative %: 59.3 % (ref 43.0–77.0)
Platelets: 214 10*3/uL (ref 150.0–400.0)
RBC: 4.97 Mil/uL (ref 3.87–5.11)
RDW: 16.5 % — ABNORMAL HIGH (ref 11.5–15.5)
WBC: 8.1 10*3/uL (ref 4.0–10.5)

## 2016-04-26 LAB — BASIC METABOLIC PANEL
BUN: 13 mg/dL (ref 6–23)
CHLORIDE: 103 meq/L (ref 96–112)
CO2: 28 meq/L (ref 19–32)
CREATININE: 0.63 mg/dL (ref 0.40–1.20)
Calcium: 9 mg/dL (ref 8.4–10.5)
GFR: 105.21 mL/min (ref >60.00)
Glucose, Bld: 100 mg/dL — ABNORMAL HIGH (ref 70–99)
Potassium: 4 mEq/L (ref 3.5–5.1)
Sodium: 137 mEq/L (ref 135–145)

## 2016-04-26 LAB — LIPID PANEL
Cholesterol: 166 mg/dL (ref 0–200)
HDL: 37.3 mg/dL — ABNORMAL LOW (ref >39.00)
LDL Cholesterol: 106 mg/dL — ABNORMAL HIGH (ref 0–99)
NonHDL: 128.9
Total CHOL/HDL Ratio: 4
Triglycerides: 116 mg/dL (ref 0.0–149.0)
VLDL: 23.2 mg/dL (ref 0.0–40.0)

## 2016-04-26 LAB — HEPATIC FUNCTION PANEL
ALT: 27 U/L (ref 0–35)
AST: 20 U/L (ref 0–37)
Albumin: 3.8 g/dL (ref 3.5–5.2)
Alkaline Phosphatase: 61 U/L (ref 39–117)
BILIRUBIN DIRECT: 0.2 mg/dL (ref 0.0–0.3)
BILIRUBIN TOTAL: 0.6 mg/dL (ref 0.2–1.2)
Total Protein: 6.6 g/dL (ref 6.0–8.3)

## 2016-04-26 LAB — TSH: TSH: 1.49 u[IU]/mL (ref 0.35–4.50)

## 2016-05-03 ENCOUNTER — Encounter: Payer: BLUE CROSS/BLUE SHIELD | Admitting: Internal Medicine

## 2016-05-17 ENCOUNTER — Encounter: Payer: Self-pay | Admitting: Internal Medicine

## 2016-05-17 ENCOUNTER — Ambulatory Visit (INDEPENDENT_AMBULATORY_CARE_PROVIDER_SITE_OTHER): Payer: Managed Care, Other (non HMO) | Admitting: Internal Medicine

## 2016-05-17 VITALS — BP 124/90 | Temp 98.0°F | Ht 68.25 in | Wt 282.4 lb

## 2016-05-17 DIAGNOSIS — F17200 Nicotine dependence, unspecified, uncomplicated: Secondary | ICD-10-CM

## 2016-05-17 DIAGNOSIS — Z79899 Other long term (current) drug therapy: Secondary | ICD-10-CM

## 2016-05-17 DIAGNOSIS — F410 Panic disorder [episodic paroxysmal anxiety] without agoraphobia: Secondary | ICD-10-CM

## 2016-05-17 DIAGNOSIS — I10 Essential (primary) hypertension: Secondary | ICD-10-CM | POA: Diagnosis not present

## 2016-05-17 DIAGNOSIS — E786 Lipoprotein deficiency: Secondary | ICD-10-CM | POA: Diagnosis not present

## 2016-05-17 DIAGNOSIS — Z23 Encounter for immunization: Secondary | ICD-10-CM | POA: Diagnosis not present

## 2016-05-17 DIAGNOSIS — D485 Neoplasm of uncertain behavior of skin: Secondary | ICD-10-CM

## 2016-05-17 DIAGNOSIS — Z0000 Encounter for general adult medical examination without abnormal findings: Secondary | ICD-10-CM

## 2016-05-17 NOTE — Progress Notes (Addendum)
Pre visit review using our clinic review tool, if applicable. No additional management support is needed unless otherwise documented below in the visit note.  Chief Complaint  Patient presents with  . Annual Exam    HPI: Patient  Joy Cooley  50 y.o. comes in today for Preventive Health Care visit     Pedometer  ocass panic attacks irritated.  Work is a Scientist, product/process development.   Otherwise   Ok  Atypical gi cp or such  No sob otherwise  Does get gerd at times.  Right arm  Nodule that is growing used to be flat  Health Maintenance  Topic Date Due  . COLONOSCOPY  09/06/2013  . Hepatitis C Screening  05/16/2017 (Originally 09/15/1965)  . PAP SMEAR  05/31/2017  . TETANUS/TDAP  04/11/2018  . MAMMOGRAM  04/20/2018  . INFLUENZA VACCINE  Addressed  . HIV Screening  Completed   Health Maintenance Review LIFESTYLE:  Exercise:    Walking   Tobacco/ETS:  1/2 ppd  Alcohol: 1-2 per week  Sugar beverages: ocass coke  Sleep: 7  Drug use: no  HH of  4  Pet dog  Work: 80 - 110   Some anxiety with hormones   Expecting grandchild   ROS:  GEN/ HEENT: No fever, significant weight changes sweats headaches vision problems hearing changes, CV/ PULM; No current chest pain shortness of breath cough, syncope,edema  change in exercise tolerance.  anxiety stress chest sx not really a pain per patient GI /GU: No adominal pain, vomiting, change in bowel habits. No blood in the stool. No significant GU symptoms. SKIN/HEME: ,no acute skin rashes suspicious lesions or bleeding. No lymphadenopathy, nodules, masses.   See  hpi  And below  NEURO/ PSYCH:  No neurologic signs such as weakness numbness. No depression anxiety. IMM/ Allergy: No unusual infections.  Allergy .   REST of 12 system review negative except as per HPI   Past Medical History:  Diagnosis Date  . Blood transfusion without reported diagnosis    from hysterectomy surgery  . GERD (gastroesophageal reflux disease)   . Hx of varicella   .  Hx: UTI (urinary tract infection)   . Recurrent headache    neck can trigger this has done pt /flexeril    Past Surgical History:  Procedure Laterality Date  . CHOLECYSTECTOMY  1994  . TONSILLECTOMY  1967  . TOTAL ABDOMINAL HYSTERECTOMY W/ BILATERAL SALPINGOOPHORECTOMY  07/15/14   has ovaries     Family History  Problem Relation Age of Onset  . Adopted: Yes    Social History   Social History  . Marital status: Divorced    Spouse name: N/A  . Number of children: N/A  . Years of education: N/A   Social History Main Topics  . Smoking status: Current Every Day Smoker  . Smokeless tobacco: Never Used  . Alcohol use No  . Drug use: No  . Sexual activity: Yes    Partners: Male   Other Topics Concern  . None   Social History Narrative   8 hours of sleep per night   Lives with your boyfriend, son and her daughter when she is home from school.   Works full time at [employer]    Divorced    hh of 4    g2 p2    Less ppd    Now working  Time Environmental education officer job             Outpatient  Medications Prior to Visit  Medication Sig Dispense Refill  . cyclobenzaprine (FLEXERIL) 5 MG tablet TAKE ONE (1) TABLET THREE (3) TIMES EACHDAY AS NEEDED FOR MUSCLE SPASMS 30 tablet 0  . lisinopril-hydrochlorothiazide (PRINZIDE,ZESTORETIC) 20-12.5 MG per tablet Take 1 tablet by mouth daily. 90 tablet 3  . lisinopril-hydrochlorothiazide (PRINZIDE,ZESTORETIC) 20-12.5 MG tablet TAKE ONE (1) TABLET EACH DAY 30 tablet 5  . omeprazole (PRILOSEC) 20 MG capsule Take 20 mg by mouth daily.    . varenicline (CHANTIX STARTING MONTH PAK) 0.5 MG X 11 & 1 MG X 42 tablet Take one 0.5 mg tablet by mouth once daily for 3 days, then increase to one 0.5 mg tablet twice daily for 4 days, then increase to one 1 mg tablet twice daily. 53 tablet 0   No facility-administered medications prior to visit.      EXAM:  BP 124/90 (BP Location: Right Arm, Patient Position: Sitting, Cuff Size:  Large)   Temp 98 F (36.7 C) (Oral)   Ht 5' 8.25" (1.734 m)   Wt 282 lb 6.4 oz (128.1 kg)   BMI 42.62 kg/m   Body mass index is 42.62 kg/m.  Physical Exam: Vital signs reviewed [id number] is a well-developed well-nourished alert cooperative    who appearsr stated age in no acute distress.  HEENT: normocephalic atraumatic , Eyes: PERRL EOM's full, conjunctiva clear, Nares: paten,t no deformity discharge or tenderness., Ears: no deformity EAC's clear TMs with normal landmarks. Mouth: clear OP, no lesions, edema.  Moist mucous membranes. Dentition in adequate repair. NECK: supple without masses, thyromegaly or bruits. CHEST/PULM:  Clear to auscultation and percussion breath sounds equal no wheeze , rales or rhonchi. No chest wall deformities or tenderness.Breast: normal by inspection . No dimpling, discharge, masses, tenderness or discharge . CV: PMI is nondisplaced, S1 S2 no gallops, murmurs, rubs. Peripheral pulses are full without delay.No JVD .  ABDOMEN: Bowel sounds normal nontender  No guard or rebound, no hepato splenomegal no CVA tenderness.  No hernia. Extremtities:  No clubbing cyanosis or edema, no acute joint swelling or redness no focal atrophy NEURO:  Oriented x3, cranial nerves 3-12 appear to be intact, no obvious focal weakness,gait within normal limits no abnormal reflexes or asymmetrical SKIN: No acute rashes normal turgor, color, no bruising or petechiae. Right forearam with 3 mm pink nodule raised   ? If capilary  No scaling  PSYCH: Oriented, good eye contact, no obvious depression anxiety, cognition and judgment appear normal. LN: no cervical axillary inguinal adenopathy EKG NSR ocass pac  Lab Results  Component Value Date   WBC 8.1 04/26/2016   HGB 13.9 04/26/2016   HCT 41.0 04/26/2016   PLT 214.0 04/26/2016   GLUCOSE 100 (H) 04/26/2016   CHOL 166 04/26/2016   TRIG 116.0 04/26/2016   HDL 37.30 (L) 04/26/2016   LDLDIRECT 130.5 03/28/2010   LDLCALC 106 (H)  04/26/2016   ALT 27 04/26/2016   AST 20 04/26/2016   NA 137 04/26/2016   K 4.0 04/26/2016   CL 103 04/26/2016   CREATININE 0.63 04/26/2016   BUN 13 04/26/2016   CO2 28 04/26/2016   TSH 1.49 04/26/2016   HGBA1C 6.0 04/16/2008   Wt Readings from Last 3 Encounters:  05/17/16 282 lb 6.4 oz (128.1 kg)  04/21/15 285 lb 1.6 oz (129.3 kg)  10/06/14 267 lb 11.2 oz (121.4 kg)    ASSESSMENT AND PLAN:  Discussed the following assessment and plan:  Visit for preventive health examination - Plan: EKG 12-Lead  Low HDL (under 40)  Tobacco use disorder - advise dc   Need for prophylactic vaccination and inoculation against influenza - Plan: Flu Vaccine QUAD 36+ mos PF IM (Fluarix & Fluzone Quad PF)  Essential hypertension - Plan: EKG 12-Lead  Medication management  Anxiety attack - dsic strategies  consider congtroller  if lsin not helpful   Severe obesity (BMI >= 40) (HCC) - weight loss will help health risk  Neoplasm of uncertain behavior of skin - recent growth  optinos disc  derm check - Plan: Ambulatory referral to Dermatology Discussed anxiety lifestyle intervention sleep eating correctly. If having recurrent problems get back with [place] consideration of controller medicine versus counseling versus other lifestyle measures. She will look into reimbursement for cologuard discuss colon cancer screening get back with [place] she needs to do this. Patient Care Team: Burnis Medin, MD as PCP - General (Internal Medicine) Nat Math, MD as Referring Physician (Obstetrics and Gynecology) Patient Instructions  Intensify lifestyle interventions. tohelp with low hdl and porderline bg  Track what you do   Consider weight watcher s program .   Healthy lifestyle includes : At least 150 minutes of exercise weeks  , weight at healthy levels, which is usually   BMI 19-25. Avoid trans fats and processed foods;  Increase fresh fruits and veges to 5 servings per day. And avoid sweet beverages  including tea and juice. Mediterranean diet with olive oil and nuts have been noted to be heart and brain healthy . Avoid tobacco products . Limit  alcohol to  7 per week for women and 14 servings for men.  Get adequate sleep . Wear seat belts . Don't text and drive .   Check if cologuard is paid for my you insurance for colon cancer screening.  And then we can send order.     Standley Brooking. Caliber Landess M.D.

## 2016-05-17 NOTE — Patient Instructions (Addendum)
Intensify lifestyle interventions. tohelp with low hdl and porderline bg  Track what you do   Consider weight watcher s program .   Healthy lifestyle includes : At least 150 minutes of exercise weeks  , weight at healthy levels, which is usually   BMI 19-25. Avoid trans fats and processed foods;  Increase fresh fruits and veges to 5 servings per day. And avoid sweet beverages including tea and juice. Mediterranean diet with olive oil and nuts have been noted to be heart and brain healthy . Avoid tobacco products . Limit  alcohol to  7 per week for women and 14 servings for men.  Get adequate sleep . Wear seat belts . Don't text and drive .   Check if cologuard is paid for my you insurance for colon cancer screening.  And then we can send order.

## 2016-05-17 NOTE — Addendum Note (Signed)
Addended byShanon Ace K on: 05/17/2016 02:59 PM   Modules accepted: Orders

## 2016-06-26 ENCOUNTER — Other Ambulatory Visit: Payer: Self-pay | Admitting: Internal Medicine

## 2016-06-28 NOTE — Telephone Encounter (Signed)
Sent to the pharmacy by e-scribe.  Pt has cpx on 05/17/16 and asked to return in 1 year.  No appt scheduled,

## 2017-04-20 ENCOUNTER — Encounter: Payer: Self-pay | Admitting: Internal Medicine

## 2017-06-14 LAB — HM MAMMOGRAPHY

## 2017-07-09 ENCOUNTER — Encounter: Payer: Self-pay | Admitting: Internal Medicine

## 2018-08-15 DIAGNOSIS — E669 Obesity, unspecified: Secondary | ICD-10-CM | POA: Diagnosis not present

## 2018-08-15 DIAGNOSIS — Z6837 Body mass index (BMI) 37.0-37.9, adult: Secondary | ICD-10-CM | POA: Diagnosis not present

## 2018-08-15 DIAGNOSIS — Z5181 Encounter for therapeutic drug level monitoring: Secondary | ICD-10-CM | POA: Diagnosis not present

## 2018-11-14 DIAGNOSIS — K219 Gastro-esophageal reflux disease without esophagitis: Secondary | ICD-10-CM | POA: Diagnosis not present

## 2018-11-14 DIAGNOSIS — F419 Anxiety disorder, unspecified: Secondary | ICD-10-CM | POA: Diagnosis not present

## 2018-11-14 DIAGNOSIS — F39 Unspecified mood [affective] disorder: Secondary | ICD-10-CM | POA: Diagnosis not present

## 2018-11-14 DIAGNOSIS — E669 Obesity, unspecified: Secondary | ICD-10-CM | POA: Diagnosis not present

## 2019-02-18 DIAGNOSIS — E669 Obesity, unspecified: Secondary | ICD-10-CM | POA: Diagnosis not present

## 2019-02-18 DIAGNOSIS — Z6835 Body mass index (BMI) 35.0-35.9, adult: Secondary | ICD-10-CM | POA: Diagnosis not present

## 2019-05-08 DIAGNOSIS — I10 Essential (primary) hypertension: Secondary | ICD-10-CM | POA: Diagnosis not present

## 2019-05-14 DIAGNOSIS — Z0001 Encounter for general adult medical examination with abnormal findings: Secondary | ICD-10-CM | POA: Diagnosis not present

## 2019-05-14 DIAGNOSIS — Z23 Encounter for immunization: Secondary | ICD-10-CM | POA: Diagnosis not present

## 2019-05-14 DIAGNOSIS — K219 Gastro-esophageal reflux disease without esophagitis: Secondary | ICD-10-CM | POA: Diagnosis not present

## 2019-05-14 DIAGNOSIS — I10 Essential (primary) hypertension: Secondary | ICD-10-CM | POA: Diagnosis not present

## 2019-12-08 DIAGNOSIS — F419 Anxiety disorder, unspecified: Secondary | ICD-10-CM | POA: Diagnosis not present

## 2019-12-08 DIAGNOSIS — M71349 Other bursal cyst, unspecified hand: Secondary | ICD-10-CM | POA: Diagnosis not present

## 2019-12-08 DIAGNOSIS — F410 Panic disorder [episodic paroxysmal anxiety] without agoraphobia: Secondary | ICD-10-CM | POA: Diagnosis not present

## 2020-01-07 DIAGNOSIS — F419 Anxiety disorder, unspecified: Secondary | ICD-10-CM | POA: Diagnosis not present

## 2020-01-07 DIAGNOSIS — F410 Panic disorder [episodic paroxysmal anxiety] without agoraphobia: Secondary | ICD-10-CM | POA: Diagnosis not present

## 2020-01-27 ENCOUNTER — Ambulatory Visit: Payer: BC Managed Care – PPO | Admitting: Podiatry

## 2020-01-29 ENCOUNTER — Ambulatory Visit (INDEPENDENT_AMBULATORY_CARE_PROVIDER_SITE_OTHER): Payer: BC Managed Care – PPO | Admitting: Podiatry

## 2020-01-29 ENCOUNTER — Other Ambulatory Visit: Payer: Self-pay

## 2020-01-29 ENCOUNTER — Ambulatory Visit: Payer: BC Managed Care – PPO | Admitting: Podiatry

## 2020-01-29 DIAGNOSIS — B351 Tinea unguium: Secondary | ICD-10-CM

## 2020-01-29 DIAGNOSIS — L603 Nail dystrophy: Secondary | ICD-10-CM | POA: Diagnosis not present

## 2020-01-29 MED ORDER — FLUCONAZOLE 150 MG PO TABS
150.0000 mg | ORAL_TABLET | Freq: Once | ORAL | 0 refills | Status: DC
Start: 2020-01-29 — End: 2020-01-30

## 2020-01-29 NOTE — Progress Notes (Signed)
  Subjective:  Patient ID: Joy Cooley, female    DOB: 1965-08-20,  MRN: [id number]  Chief Complaint  Patient presents with  . Nail Problem    Nail trim 1-5 bilateral  . Nail Problem    Pt states thick discolored brittle toenails 10 year duration. Pt states interest in oral lamisil and laser therapy.    54 y.o. female presents with the above complaint. History confirmed with patient.   Objective:  Physical Exam: warm, good capillary refill, nail exam onychomycosis of the toenails, no trophic changes or ulcerative lesions, normal DP and PT pulses and normal sensory exam. Left Foot: normal exam, no swelling, tenderness, instability; ligaments intact, full range of motion of all ankle/foot joints  Right Foot: normal exam, no swelling, tenderness, instability; ligaments intact, full range of motion of all ankle/foot joints   Assessment:   1. Onychomycosis   2. Nail dystrophy      Plan:  Patient was evaluated and treated and all questions answered.  Onychomycosis -Educated on etiology -Nails debrided of dystrophy -Start Fluconazole weekly. Discussed r/b of medication -Discussed holding off laser at this time     Return in about 3 months (around 04/30/2020) for Nail Fungus.

## 2020-01-30 ENCOUNTER — Telehealth: Payer: Self-pay | Admitting: Podiatry

## 2020-01-30 MED ORDER — FLUCONAZOLE 150 MG PO TABS
150.0000 mg | ORAL_TABLET | Freq: Once | ORAL | 0 refills | Status: AC
Start: 2020-01-30 — End: 2020-01-30

## 2020-01-30 NOTE — Telephone Encounter (Signed)
Pt called and stated that she was suppose to have diflucan called in to her pharmacy and she has not received medication

## 2020-01-30 NOTE — Telephone Encounter (Signed)
Message addressed.

## 2020-01-30 NOTE — Addendum Note (Signed)
Addended by: Hardie Pulley on: 01/30/2020 02:22 PM   Modules accepted: Orders

## 2020-02-06 ENCOUNTER — Telehealth: Payer: Self-pay | Admitting: Podiatry

## 2020-02-06 MED ORDER — FLUCONAZOLE 150 MG PO TABS
150.0000 mg | ORAL_TABLET | ORAL | 2 refills | Status: DC
Start: 2020-02-06 — End: 2020-02-17

## 2020-02-06 NOTE — Telephone Encounter (Signed)
Pt called and received her medication but wanted to know if it was suppose to be just for 1 tablet and if that was how the script was meant just 1 tablet

## 2020-02-06 NOTE — Addendum Note (Signed)
Addended by: Hardie Pulley on: 02/06/2020 06:41 PM   Modules accepted: Orders

## 2020-02-09 NOTE — Telephone Encounter (Signed)
I spoke with pt and informed of Dr. Eleanora Neighbor change of orders and pt states understanding.

## 2020-02-13 ENCOUNTER — Telehealth: Payer: Self-pay | Admitting: Podiatry

## 2020-02-13 NOTE — Telephone Encounter (Signed)
Pt called stating she still had not received her medication she wanted the medication to be sent to [facility] on [address] in [place]

## 2020-02-16 ENCOUNTER — Other Ambulatory Visit: Payer: Self-pay | Admitting: Podiatry

## 2020-02-17 ENCOUNTER — Telehealth: Payer: Self-pay | Admitting: Podiatry

## 2020-02-17 MED ORDER — FLUCONAZOLE 150 MG PO TABS
150.0000 mg | ORAL_TABLET | ORAL | 2 refills | Status: DC
Start: 2020-02-17 — End: 2020-05-07

## 2020-02-17 NOTE — Addendum Note (Signed)
Addended by: Harriett Sine D on: 02/17/2020 04:15 PM   Modules accepted: Orders

## 2020-02-17 NOTE — Telephone Encounter (Signed)
I informed pt the medication had been sent to [place] at 4:13pm today.

## 2020-02-17 NOTE — Telephone Encounter (Signed)
Pt has called asking for assistance in getting a medication called in to pharmacy diflucan pt only received 1 tablet

## 2020-02-17 NOTE — Telephone Encounter (Signed)
I saw you put in order did you call patient? Idk what happened because I did put the order in over a week ago

## 2020-03-31 DIAGNOSIS — R61 Generalized hyperhidrosis: Secondary | ICD-10-CM | POA: Diagnosis not present

## 2020-03-31 DIAGNOSIS — F39 Unspecified mood [affective] disorder: Secondary | ICD-10-CM | POA: Diagnosis not present

## 2020-04-30 ENCOUNTER — Ambulatory Visit: Payer: BC Managed Care – PPO | Admitting: Podiatry

## 2020-04-30 DIAGNOSIS — R61 Generalized hyperhidrosis: Secondary | ICD-10-CM | POA: Diagnosis not present

## 2020-04-30 DIAGNOSIS — F39 Unspecified mood [affective] disorder: Secondary | ICD-10-CM | POA: Diagnosis not present

## 2020-05-06 ENCOUNTER — Other Ambulatory Visit (HOSPITAL_COMMUNITY): Payer: Self-pay | Admitting: Internal Medicine

## 2020-05-06 DIAGNOSIS — Z1231 Encounter for screening mammogram for malignant neoplasm of breast: Secondary | ICD-10-CM

## 2020-05-07 ENCOUNTER — Ambulatory Visit (INDEPENDENT_AMBULATORY_CARE_PROVIDER_SITE_OTHER): Payer: BC Managed Care – PPO | Admitting: Podiatry

## 2020-05-07 ENCOUNTER — Other Ambulatory Visit: Payer: Self-pay

## 2020-05-07 DIAGNOSIS — L603 Nail dystrophy: Secondary | ICD-10-CM

## 2020-05-07 DIAGNOSIS — B351 Tinea unguium: Secondary | ICD-10-CM

## 2020-05-07 MED ORDER — FLUCONAZOLE 150 MG PO TABS: 150.0000 mg | ORAL_TABLET | Freq: Every day | ORAL | 0 refills | Status: AC

## 2020-05-19 ENCOUNTER — Ambulatory Visit (HOSPITAL_COMMUNITY): Payer: Managed Care, Other (non HMO)

## 2020-05-30 NOTE — Progress Notes (Signed)
°  Subjective:  Patient ID: Joy Cooley, female    DOB: Mar 13, 1966,  MRN: [id number]  Chief Complaint  Patient presents with   Nail Problem    onychomycosis    54 y.o. female presents with the above complaint. States that the nails are doing better denies issues with the medication.  Objective:  Physical Exam: warm, good capillary refill, nail exam onychomycosis of the toenails, no trophic changes or ulcerative lesions, normal DP and PT pulses and normal sensory exam. Left Foot: normal exam, no swelling, tenderness, instability; ligaments intact, full range of motion of all ankle/foot joints  Right Foot: normal exam, no swelling, tenderness, instability; ligaments intact, full range of motion of all ankle/foot joints   Assessment:   1. Onychomycosis   2. Nail dystrophy      Plan:  Patient was evaluated and treated and all questions answered.  Onychomycosis -Improving. Continue fluconazole weekly. Refill today     Return in about 3 months (around 08/07/2020) for Nail Fungus.

## 2020-06-01 ENCOUNTER — Ambulatory Visit (INDEPENDENT_AMBULATORY_CARE_PROVIDER_SITE_OTHER): Payer: BC Managed Care – PPO

## 2020-06-01 ENCOUNTER — Ambulatory Visit (INDEPENDENT_AMBULATORY_CARE_PROVIDER_SITE_OTHER): Payer: BC Managed Care – PPO | Admitting: Podiatry

## 2020-06-01 ENCOUNTER — Other Ambulatory Visit: Payer: Self-pay

## 2020-06-01 ENCOUNTER — Other Ambulatory Visit: Payer: Self-pay | Admitting: Podiatry

## 2020-06-01 ENCOUNTER — Encounter: Payer: Self-pay | Admitting: Podiatry

## 2020-06-01 DIAGNOSIS — M25871 Other specified joint disorders, right ankle and foot: Secondary | ICD-10-CM

## 2020-06-01 DIAGNOSIS — M19079 Primary osteoarthritis, unspecified ankle and foot: Secondary | ICD-10-CM

## 2020-06-01 DIAGNOSIS — M2580 Other specified joint disorders, unspecified joint: Secondary | ICD-10-CM

## 2020-06-01 DIAGNOSIS — M79671 Pain in right foot: Secondary | ICD-10-CM

## 2020-06-01 NOTE — Progress Notes (Signed)
  Subjective:  Patient ID: Joy Cooley, female    DOB: 03-Jun-1966,  MRN: [id number]  Chief Complaint  Patient presents with  . Foot Pain    Right dorsal foot pain/swelling off and on. No known injuries. Pt states pain is severe.  . Foot Pain    Generalized foot pain that shoots into entire leg, pt states history of sciatica.    54 y.o. female presents with the above complaint. History confirmed with patient.   Objective:  Physical Exam: warm, good capillary refill, no trophic changes or ulcerative lesions, normal DP and PT pulses and normal sensory exam. Right Foot: POP tibial sesamoid, dorsal midfoot   No images are attached to the encounter.  Radiographs: X-ray of the right foot: [place] fault, bipartite tibial sesamoid, does not appear to be fracture. No acute fractures. Dorsal 1st Met spurring, degenerative changes. Assessment:   1. Sesamoiditis   2. Arthritis, midfoot     Plan:  Patient was evaluated and treated and all questions answered.  Sesamoiditis -Educated on etiology -XR reviewed with patient -Discussed padding and proper shoegear -Offloading pad applied -Injection delivered to the painful joint  Procedure: Joint Injection Location: Right 2nd TMT, tibial sesamoid Skin Prep: Alcohol. Injectate: 0.5 cc 1% lidocaine plain, 0.5 cc dexamethasone phosphate. Disposition: Patient tolerated procedure well. Injection site dressed with a band-aid.   No follow-ups on file.

## 2020-07-01 DIAGNOSIS — Z1231 Encounter for screening mammogram for malignant neoplasm of breast: Secondary | ICD-10-CM | POA: Diagnosis not present

## 2020-07-02 ENCOUNTER — Ambulatory Visit: Payer: BC Managed Care – PPO | Admitting: Podiatry

## 2020-07-19 DIAGNOSIS — J069 Acute upper respiratory infection, unspecified: Secondary | ICD-10-CM | POA: Diagnosis not present

## 2020-07-28 DIAGNOSIS — F410 Panic disorder [episodic paroxysmal anxiety] without agoraphobia: Secondary | ICD-10-CM | POA: Diagnosis not present

## 2020-07-28 DIAGNOSIS — Z6835 Body mass index (BMI) 35.0-35.9, adult: Secondary | ICD-10-CM | POA: Diagnosis not present

## 2020-07-28 DIAGNOSIS — Z6836 Body mass index (BMI) 36.0-36.9, adult: Secondary | ICD-10-CM | POA: Diagnosis not present

## 2020-07-28 DIAGNOSIS — J069 Acute upper respiratory infection, unspecified: Secondary | ICD-10-CM | POA: Diagnosis not present

## 2020-08-13 ENCOUNTER — Ambulatory Visit: Payer: BC Managed Care – PPO | Admitting: Podiatry

## 2020-08-27 ENCOUNTER — Telehealth: Payer: Self-pay | Admitting: Podiatry

## 2020-08-27 MED ORDER — MELOXICAM 15 MG PO TABS: 15.0000 mg | ORAL_TABLET | Freq: Every day | ORAL | 0 refills | Status: DC

## 2020-08-27 NOTE — Telephone Encounter (Signed)
Pt is requesting pain meds for her right foot. She is scheduled to come in for an appointment Tuesday but she would like to have something to hold her over the weekend. And she also wants to know if she shoud use heat or ice to help with it as

## 2020-08-27 NOTE — Addendum Note (Signed)
Addended by: Hardie Pulley on: 08/27/2020 03:23 PM   Modules accepted: Orders

## 2020-08-27 NOTE — Telephone Encounter (Signed)
Please advise 

## 2020-08-31 ENCOUNTER — Other Ambulatory Visit: Payer: Self-pay

## 2020-08-31 ENCOUNTER — Other Ambulatory Visit: Payer: Self-pay | Admitting: Podiatry

## 2020-08-31 ENCOUNTER — Ambulatory Visit (INDEPENDENT_AMBULATORY_CARE_PROVIDER_SITE_OTHER): Payer: BC Managed Care – PPO | Admitting: Podiatry

## 2020-08-31 DIAGNOSIS — M2580 Other specified joint disorders, unspecified joint: Secondary | ICD-10-CM | POA: Diagnosis not present

## 2020-08-31 DIAGNOSIS — M1990 Unspecified osteoarthritis, unspecified site: Secondary | ICD-10-CM

## 2020-08-31 DIAGNOSIS — M19079 Primary osteoarthritis, unspecified ankle and foot: Secondary | ICD-10-CM | POA: Diagnosis not present

## 2020-08-31 DIAGNOSIS — B351 Tinea unguium: Secondary | ICD-10-CM | POA: Diagnosis not present

## 2020-08-31 MED ORDER — FLUCONAZOLE 150 MG PO TABS: 150.0000 mg | ORAL_TABLET | Freq: Every day | ORAL | 1 refills | Status: DC

## 2020-09-22 DIAGNOSIS — F410 Panic disorder [episodic paroxysmal anxiety] without agoraphobia: Secondary | ICD-10-CM | POA: Diagnosis not present

## 2020-09-22 DIAGNOSIS — E6609 Other obesity due to excess calories: Secondary | ICD-10-CM | POA: Diagnosis not present

## 2020-09-22 DIAGNOSIS — Z6835 Body mass index (BMI) 35.0-35.9, adult: Secondary | ICD-10-CM | POA: Diagnosis not present

## 2020-09-22 DIAGNOSIS — Z6836 Body mass index (BMI) 36.0-36.9, adult: Secondary | ICD-10-CM | POA: Diagnosis not present

## 2020-09-22 DIAGNOSIS — I10 Essential (primary) hypertension: Secondary | ICD-10-CM | POA: Diagnosis not present

## 2020-09-22 DIAGNOSIS — Z716 Tobacco abuse counseling: Secondary | ICD-10-CM | POA: Diagnosis not present

## 2020-09-22 DIAGNOSIS — M542 Cervicalgia: Secondary | ICD-10-CM | POA: Diagnosis not present

## 2020-09-23 ENCOUNTER — Other Ambulatory Visit: Payer: Self-pay | Admitting: Podiatry

## 2020-09-23 DIAGNOSIS — Z0001 Encounter for general adult medical examination with abnormal findings: Secondary | ICD-10-CM | POA: Diagnosis not present

## 2020-09-23 DIAGNOSIS — I10 Essential (primary) hypertension: Secondary | ICD-10-CM | POA: Diagnosis not present

## 2020-09-23 DIAGNOSIS — F329 Major depressive disorder, single episode, unspecified: Secondary | ICD-10-CM | POA: Diagnosis not present

## 2020-09-23 DIAGNOSIS — K219 Gastro-esophageal reflux disease without esophagitis: Secondary | ICD-10-CM | POA: Diagnosis not present

## 2020-09-27 NOTE — Progress Notes (Signed)
  Subjective:  Patient ID: Joy Cooley, female    DOB: [birth year],  MRN: [id number]  Chief Complaint  Patient presents with  . Foot Pain    F/U Rt foot pain -pt states," it flares up and then it goes away; 10/10 sharp pains." -pt states injection did not help -w/ numbness around toes -wrose with resting or not doing anything -more swelling tx: meloxicam, elevation, aspercreme, and cushion     55 y.o. female presents with the above complaint. History confirmed with patient.   Objective:  Physical Exam: warm, good capillary refill, no trophic changes or ulcerative lesions, normal DP and PT pulses and normal sensory exam. Right Foot: POP tibial sesamoid, dorsal midfoot   No images are attached to the encounter.  Radiographs: 11/2 X-ray of the right foot: [place] fault, bipartite tibial sesamoid, does not appear to be fracture. No acute fractures. Dorsal 1st Met spurring, degenerative changes. Assessment:   1. Sesamoiditis   2. Arthritis, midfoot   3. Onychomycosis     Plan:  Patient was evaluated and treated and all questions answered.  Sesamoiditis -Discussed continued padding and shoe gear modifications.  Should pain persist would consider surgical intervention.  Onychomycosis -Rx fluconazole weekly.  Return in about 6 weeks (around 10/12/2020) for Capsulitis, Nail Fungus.

## 2020-10-12 ENCOUNTER — Ambulatory Visit (INDEPENDENT_AMBULATORY_CARE_PROVIDER_SITE_OTHER): Payer: BC Managed Care – PPO | Admitting: Podiatry

## 2020-10-12 ENCOUNTER — Other Ambulatory Visit: Payer: Self-pay

## 2020-10-12 DIAGNOSIS — B351 Tinea unguium: Secondary | ICD-10-CM

## 2020-10-12 DIAGNOSIS — M2580 Other specified joint disorders, unspecified joint: Secondary | ICD-10-CM | POA: Diagnosis not present

## 2020-10-12 DIAGNOSIS — L603 Nail dystrophy: Secondary | ICD-10-CM | POA: Diagnosis not present

## 2020-10-12 NOTE — Progress Notes (Signed)
  Subjective:  Patient ID: Joy Cooley, female    DOB: [birth year],  MRN: [id number]  Chief Complaint  Patient presents with  . Foot Pain    Follow up right foot pain. Pt states improvement.     55 y.o. female presents with the above complaint. History confirmed with patient. States the pain is much better, has been applying pads. Taking the fluconazole weekly as directed.  Objective:  Physical Exam: warm, good capillary refill, no trophic changes or ulcerative lesions, normal DP and PT pulses and normal sensory exam. Right Foot: No POP tibial sesamoid, dorsal midfoot   No images are attached to the encounter.  Radiographs: 11/2 X-ray of the right foot: [place] fault, bipartite tibial sesamoid, does not appear to be fracture. No acute fractures. Dorsal 1st Met spurring, degenerative changes. Assessment:   1. Sesamoiditis   2. Nail dystrophy   3. Onychomycosis     Plan:  Patient was evaluated and treated and all questions answered.  Sesamoiditis -Appears improved. Trial d/c padding/night splint  Onychomycosis -Cont fluconazole weekly to completion. Patient to f/u if she is not pleased with progress. -Nails gently debrided of ingrowing nails  Return if symptoms worsen or fail to improve.

## 2020-10-21 ENCOUNTER — Other Ambulatory Visit: Payer: Self-pay | Admitting: Podiatry

## 2020-11-04 DIAGNOSIS — F410 Panic disorder [episodic paroxysmal anxiety] without agoraphobia: Secondary | ICD-10-CM | POA: Diagnosis not present

## 2020-11-04 DIAGNOSIS — F329 Major depressive disorder, single episode, unspecified: Secondary | ICD-10-CM | POA: Diagnosis not present

## 2020-11-04 DIAGNOSIS — F39 Unspecified mood [affective] disorder: Secondary | ICD-10-CM | POA: Diagnosis not present

## 2020-11-17 ENCOUNTER — Other Ambulatory Visit: Payer: Self-pay | Admitting: Podiatry

## 2020-12-19 ENCOUNTER — Other Ambulatory Visit: Payer: Self-pay | Admitting: Podiatry

## 2021-03-25 ENCOUNTER — Other Ambulatory Visit: Payer: Self-pay | Admitting: Podiatry

## 2021-09-30 ENCOUNTER — Other Ambulatory Visit (HOSPITAL_COMMUNITY): Payer: Self-pay | Admitting: Family Medicine

## 2021-09-30 DIAGNOSIS — Z1231 Encounter for screening mammogram for malignant neoplasm of breast: Secondary | ICD-10-CM

## 2021-10-24 LAB — COLOGUARD: COLOGUARD: POSITIVE — AB

## 2021-10-26 ENCOUNTER — Other Ambulatory Visit (HOSPITAL_COMMUNITY): Payer: Self-pay | Admitting: Family Medicine

## 2021-10-26 ENCOUNTER — Inpatient Hospital Stay
Admission: RE | Admit: 2021-10-26 | Discharge: 2021-10-26 | Disposition: A | Discharge status: Home / Self Care | Payer: Self-pay | Source: Ambulatory Visit | Attending: Family Medicine | Admitting: Family Medicine

## 2021-10-26 ENCOUNTER — Ambulatory Visit (HOSPITAL_COMMUNITY): Payer: BC Managed Care – PPO

## 2021-10-26 DIAGNOSIS — Z1231 Encounter for screening mammogram for malignant neoplasm of breast: Secondary | ICD-10-CM

## 2021-10-27 ENCOUNTER — Ambulatory Visit (HOSPITAL_COMMUNITY): Payer: BC Managed Care – PPO

## 2021-10-28 ENCOUNTER — Ambulatory Visit (HOSPITAL_COMMUNITY)
Admission: RE | Admit: 2021-10-28 | Discharge: 2021-10-28 | Disposition: A | Discharge status: Home / Self Care | Payer: 59 | Source: Ambulatory Visit | Attending: Family Medicine | Admitting: Family Medicine

## 2021-10-28 DIAGNOSIS — Z1231 Encounter for screening mammogram for malignant neoplasm of breast: Secondary | ICD-10-CM | POA: Diagnosis present

## 2021-11-10 ENCOUNTER — Encounter: Payer: Self-pay | Admitting: *Deleted

## 2022-01-16 ENCOUNTER — Ambulatory Visit: Payer: 59

## 2022-01-23 ENCOUNTER — Encounter: Payer: Self-pay | Admitting: *Deleted

## 2022-01-23 ENCOUNTER — Encounter (INDEPENDENT_AMBULATORY_CARE_PROVIDER_SITE_OTHER): Payer: Self-pay | Admitting: *Deleted

## 2022-02-15 ENCOUNTER — Ambulatory Visit: Payer: 59

## 2022-03-23 ENCOUNTER — Encounter: Payer: Self-pay | Admitting: *Deleted

## 2022-03-23 NOTE — Patient Instructions (Signed)
  Procedure: Colonoscopy Estimated body mass index is 34.72 kg/m as calculated from the following:   Height as of this encounter: '5\' 10"'$  (1.778 m).   Weight as of this encounter: 242 lb (109.8 kg).   Have you had a colonoscopy before?  10 years ago [facility]  Do you have family history of colon cancer  no  Do you have a family history of polyps? no  Previous colonoscopy with polyps removed? no  Do you have a history colorectal cancer?   no  Are you diabetic?  no  Do you have a prosthetic or mechanical heart valve? no  Do you have a pacemaker/defibrillator?   no  Have you had endocarditis/atrial fibrillation?  no  Do you use supplemental oxygen/CPAP?  no  Have you had joint replacement within the last 12 months?  no  Do you tend to be constipated or have to use laxatives?  no   Do you have history of alcohol use? If yes, how much and how often.  no  Do you have history or are you using drugs? If yes, what do are you  using?  no  Have you ever had a stroke/heart attack?  no  Have you ever had a heart or other vascular stent placed,?no  Do you take weight loss medication? no  female patients,: have you had a hysterectomy? yes                              are you post menopausal?  no                              do you still have your menstrual cycle? no    Date of last menstrual period.   Do you take any blood-thinning medications such as: (Plavix, aspirin, Coumadin, Aggrenox, Brilinta, Xarelto, Eliquis, Pradaxa, Savaysa or Effient) no  If yes we need the name, milligram, dosage and who is prescribing doctor:               Current Outpatient Medications  Medication Sig Dispense Refill   ALPRAZolam (XANAX) 1 MG tablet Take 1 mg by mouth as needed for anxiety.     escitalopram (LEXAPRO) 20 MG tablet Take by mouth.     lisinopril-hydrochlorothiazide (ZESTORETIC) 20-25 MG tablet Take 1 tablet by mouth daily.     omeprazole (PRILOSEC) 20 MG capsule Take 20 mg by mouth  daily.     rosuvastatin (CRESTOR) 5 MG tablet Take 5 mg by mouth daily.     No current facility-administered medications for this visit.    Allergies  Allergen Reactions   Alprazolam     REACTION: palpitations   Erythromycin

## 2022-03-30 NOTE — Progress Notes (Signed)
LMOVM to call back 

## 2022-03-30 NOTE — Progress Notes (Signed)
ASA 2. Needs BMP prior due to diuretic.

## 2022-04-04 ENCOUNTER — Encounter: Payer: Self-pay | Admitting: *Deleted

## 2022-04-04 NOTE — Progress Notes (Signed)
Mailed letter °

## 2022-04-04 NOTE — Progress Notes (Signed)
LMOVM to return call.

## 2022-04-18 ENCOUNTER — Other Ambulatory Visit (HOSPITAL_COMMUNITY): Payer: Self-pay | Admitting: Family Medicine

## 2022-04-18 DIAGNOSIS — Z1239 Encounter for other screening for malignant neoplasm of breast: Secondary | ICD-10-CM

## 2022-07-04 ENCOUNTER — Encounter: Payer: Self-pay | Admitting: *Deleted

## 2022-07-07 ENCOUNTER — Other Ambulatory Visit (HOSPITAL_COMMUNITY): Payer: Self-pay | Admitting: Family Medicine

## 2022-07-07 DIAGNOSIS — R1011 Right upper quadrant pain: Secondary | ICD-10-CM

## 2022-07-18 ENCOUNTER — Other Ambulatory Visit (HOSPITAL_COMMUNITY): Payer: Self-pay | Admitting: Family Medicine

## 2022-07-18 ENCOUNTER — Ambulatory Visit (HOSPITAL_COMMUNITY)
Admission: RE | Admit: 2022-07-18 | Discharge: 2022-07-18 | Disposition: A | Discharge status: Home / Self Care | Payer: 59 | Source: Ambulatory Visit | Attending: Family Medicine | Admitting: Family Medicine

## 2022-07-18 DIAGNOSIS — R1011 Right upper quadrant pain: Secondary | ICD-10-CM | POA: Insufficient documentation

## 2022-07-18 DIAGNOSIS — R1900 Intra-abdominal and pelvic swelling, mass and lump, unspecified site: Secondary | ICD-10-CM

## 2022-10-19 ENCOUNTER — Other Ambulatory Visit (HOSPITAL_COMMUNITY): Payer: Self-pay | Admitting: Family Medicine

## 2022-10-19 DIAGNOSIS — R1900 Intra-abdominal and pelvic swelling, mass and lump, unspecified site: Secondary | ICD-10-CM

## 2022-10-20 ENCOUNTER — Encounter: Payer: Self-pay | Admitting: *Deleted

## 2022-12-13 ENCOUNTER — Ambulatory Visit (HOSPITAL_COMMUNITY)
Admission: RE | Admit: 2022-12-13 | Discharge: 2022-12-13 | Disposition: A | Discharge status: Home / Self Care | Payer: 59 | Source: Ambulatory Visit | Attending: Family Medicine | Admitting: Family Medicine

## 2022-12-13 DIAGNOSIS — R1900 Intra-abdominal and pelvic swelling, mass and lump, unspecified site: Secondary | ICD-10-CM | POA: Diagnosis present

## 2023-04-20 ENCOUNTER — Other Ambulatory Visit (HOSPITAL_COMMUNITY): Payer: Self-pay | Admitting: Family Medicine

## 2023-04-20 DIAGNOSIS — Z1231 Encounter for screening mammogram for malignant neoplasm of breast: Secondary | ICD-10-CM

## 2023-05-05 IMAGING — MG MM DIGITAL SCREENING BILAT W/ TOMO AND CAD
6 of 10 series · 6 of 30 positions shown · non-contrast
Comparison: Previous exam(s).

CLINICAL DATA: Screening.

EXAM:
DIGITAL SCREENING BILATERAL MAMMOGRAM WITH TOMOSYNTHESIS AND CAD
TECHNIQUE: Bilateral screening digital craniocaudal and mediolateral oblique
mammograms were obtained. Bilateral screening digital breast
tomosynthesis was performed. The images were evaluated with
computer-aided detection.

[L CC synth-2D]
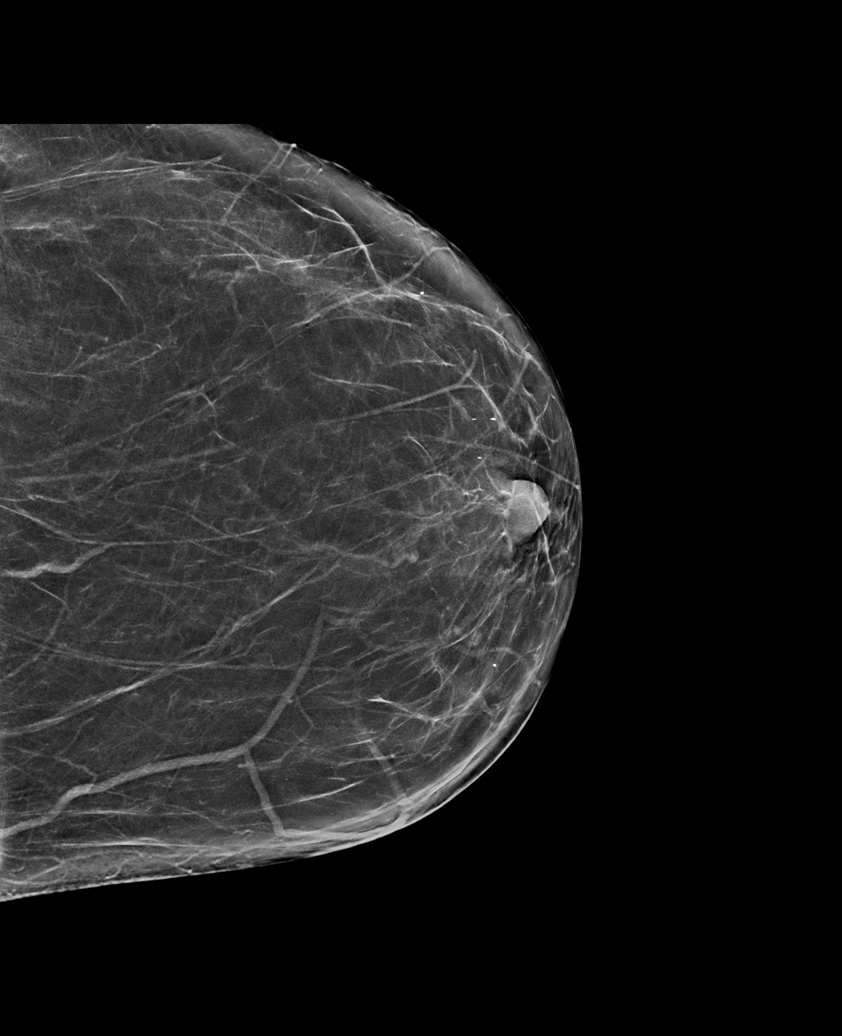

[R CC synth-2D (1 of 2)]
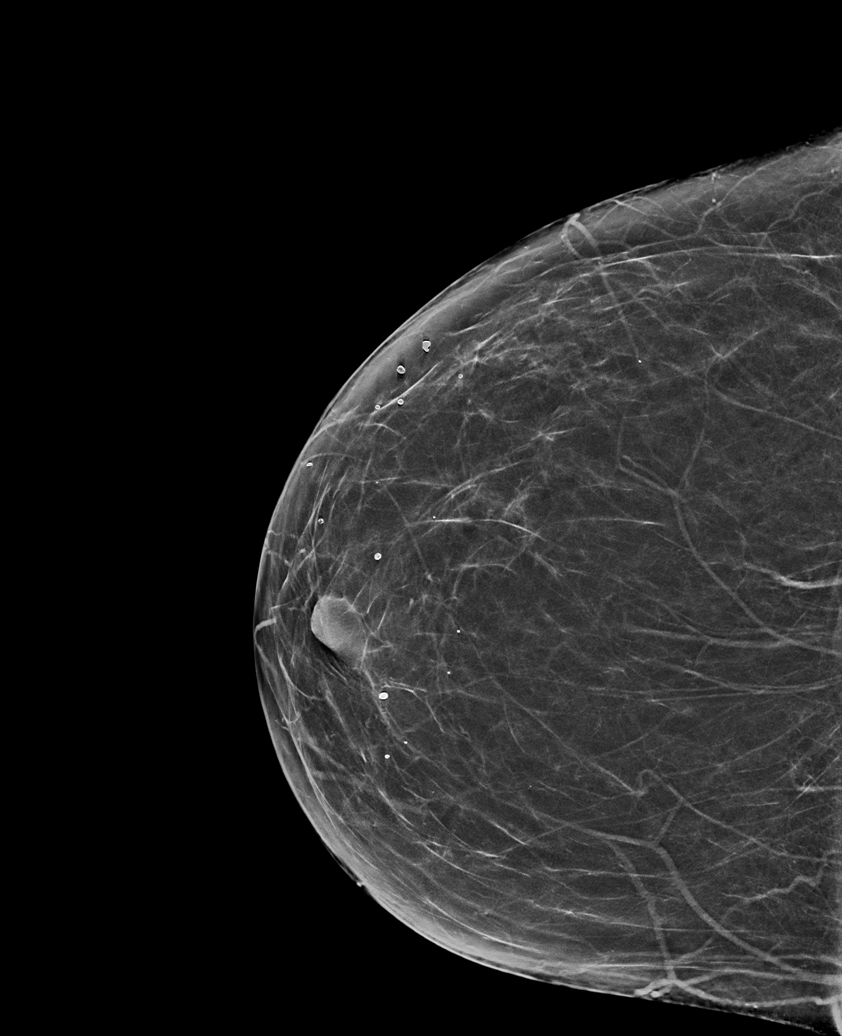

[R MLO synth-2D]
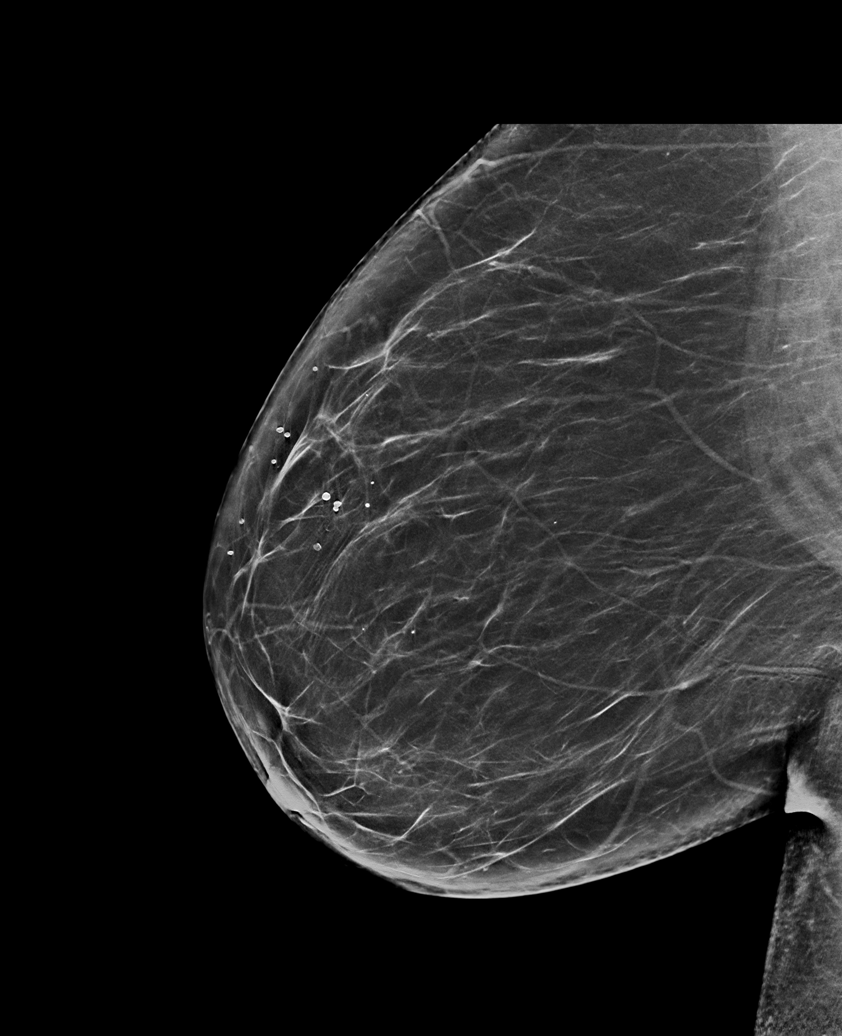

[R CC synth-2D (2 of 2)]
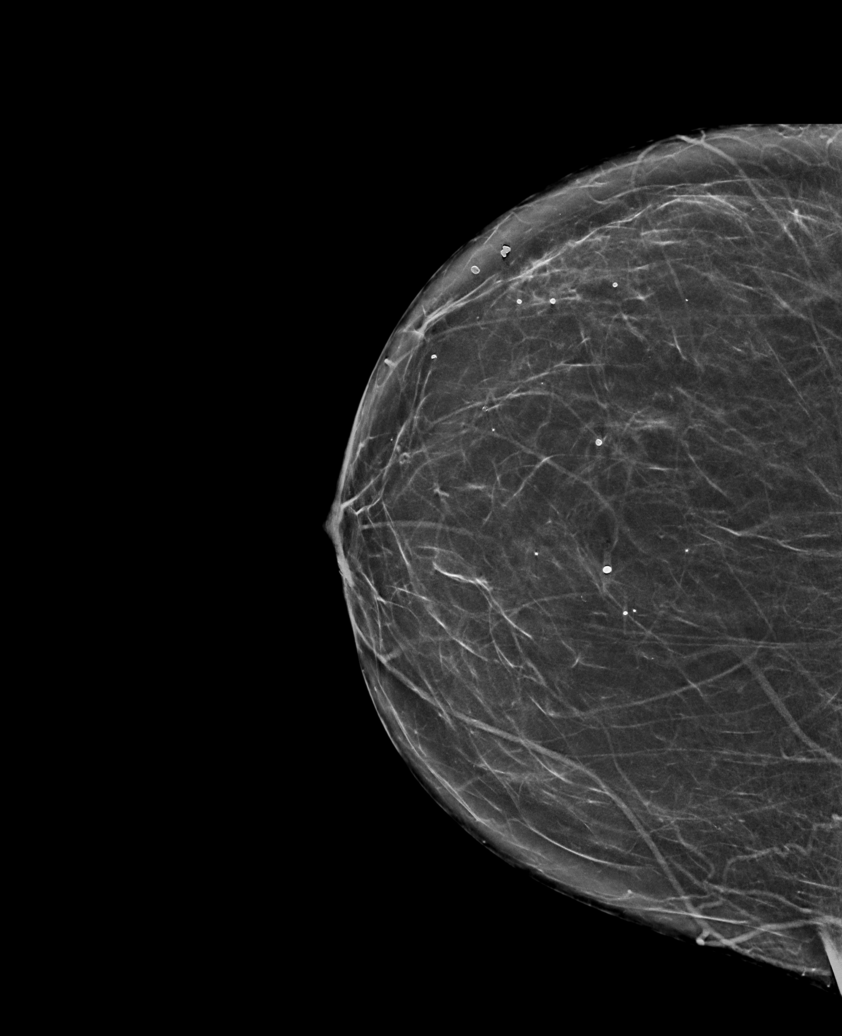

[L MLO synth-2D]
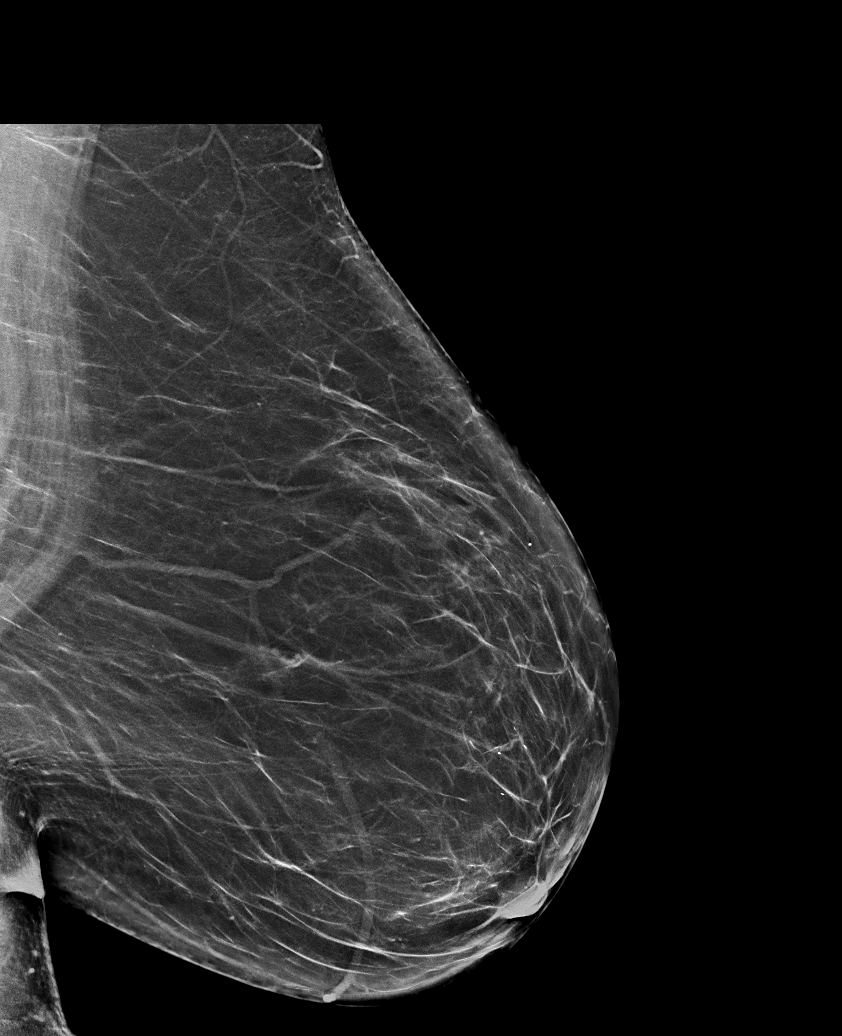

[L CC tomo · tomo slice 35/68.0]
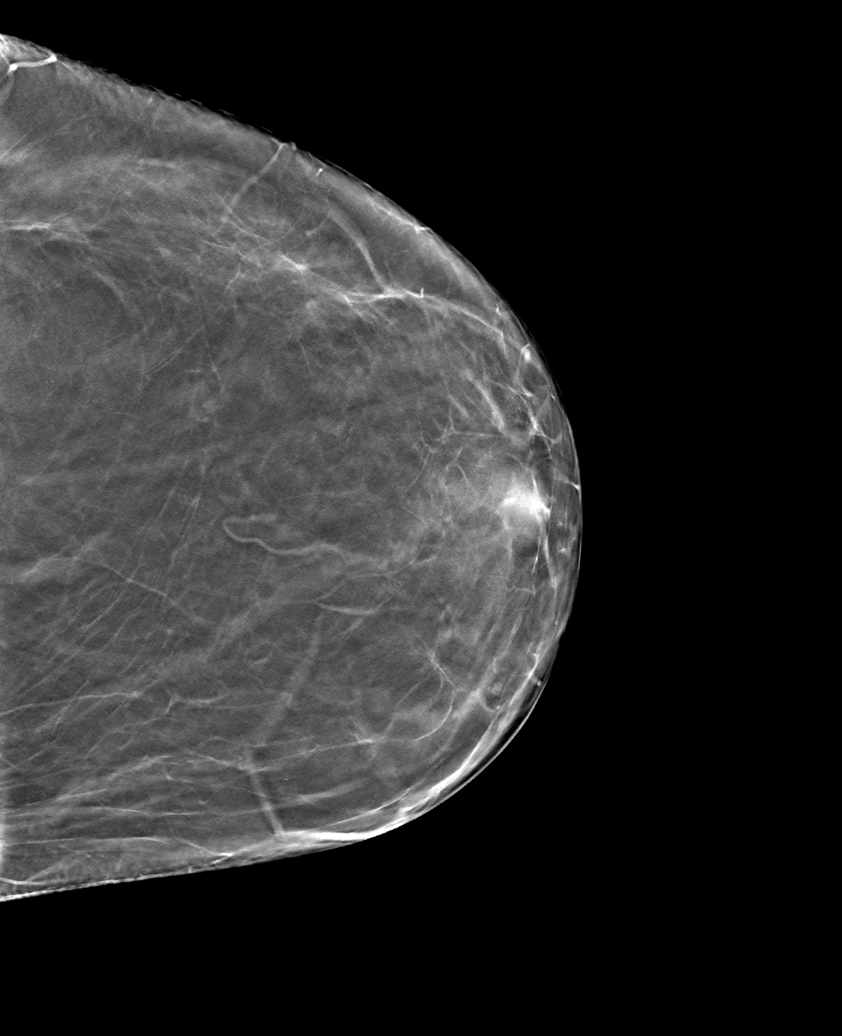

[6 of 30 positions shown; findings below may reference images not displayed]

ACR Breast Density Category b: There are scattered areas of
fibroglandular density.
FINDINGS: There are no findings suspicious for malignancy.
IMPRESSION: No mammographic evidence of malignancy. A result letter of this
screening mammogram will be mailed directly to the patient.

RECOMMENDATION:
Screening mammogram in one year. (Code:51-O-LD2)

BI-RADS CATEGORY  1: Negative.

## 2023-09-22 ENCOUNTER — Encounter: Payer: Self-pay | Admitting: Emergency Medicine

## 2023-09-22 ENCOUNTER — Other Ambulatory Visit: Payer: Self-pay

## 2023-09-22 ENCOUNTER — Ambulatory Visit
Admission: EM | Admit: 2023-09-22 | Discharge: 2023-09-22 | Disposition: A | Discharge status: Home / Self Care | Payer: 59 | Attending: Family Medicine | Admitting: Family Medicine

## 2023-09-22 DIAGNOSIS — S7001XA Contusion of right hip, initial encounter: Secondary | ICD-10-CM | POA: Diagnosis not present

## 2023-09-22 DIAGNOSIS — W19XXXA Unspecified fall, initial encounter: Secondary | ICD-10-CM

## 2023-09-22 DIAGNOSIS — M25562 Pain in left knee: Secondary | ICD-10-CM | POA: Diagnosis not present

## 2023-09-22 DIAGNOSIS — M25462 Effusion, left knee: Secondary | ICD-10-CM

## 2023-09-22 DIAGNOSIS — M79604 Pain in right leg: Secondary | ICD-10-CM

## 2023-09-22 MED ORDER — TIZANIDINE HCL 4 MG PO CAPS
4.0000 mg | ORAL_CAPSULE | Freq: Three times a day (TID) | ORAL | 0 refills | Status: DC | PRN
Start: 2023-09-22 — End: 2024-05-28

## 2023-09-22 MED ORDER — NAPROXEN 500 MG PO TABS: 500.0000 mg | ORAL_TABLET | Freq: Two times a day (BID) | ORAL | 0 refills | Status: AC | PRN

## 2023-09-22 NOTE — Discharge Instructions (Signed)
 Your exam is very reassuring today.  You may ice, elevate, rest and I have sent over anti-inflammatory pain medication and muscle relaxers.  Follow-up for significantly worsening symptoms.

## 2023-09-22 NOTE — ED Triage Notes (Signed)
 Pt reports missed bottom step while walking into garage yesterday. Pt reports right hip, knee, foot, left knee pain ever since. Denies hitting head or loc, blood thinners.

## 2023-09-26 NOTE — ED Provider Notes (Signed)
 [facility]    CSN: [id number] Arrival date & time: 09/22/23  1216      History   Chief Complaint Chief Complaint  Patient presents with   Fall    HPI Joy Cooley is a 58 y.o. female.   Patient presenting today with right hip, knee, foot pain and left knee pain since missing a step walking into her garage yesterday and falling.  She denies head injury, loss of consciousness, dizziness, nausea, vomiting, mental status changes, extremity weakness numbness tingling, decreased range of motion and is not on any anticoagulation.  So far trying over-the-counter remedies with minimal relief.    Past Medical History:  Diagnosis Date   Blood transfusion without reported diagnosis    from hysterectomy surgery   GERD (gastroesophageal reflux disease)    Hx of varicella    Hx: UTI (urinary tract infection)    Recurrent headache    neck can trigger this has done pt /flexeril    Patient Active Problem List   Diagnosis Date Noted   Low HDL (under 40) 05/17/2016   Right knee pain 10/06/2014   Essential hypertension 10/06/2014   EDEMA 03/28/2010   ELEVATED BLOOD PRESSURE WITHOUT DIAGNOSIS OF HYPERTENSION 03/28/2010   HYPERLIPIDEMIA 07/07/2009   UNSPECIFIED ANEMIA 07/07/2009   Disturbance in sleep behavior 07/07/2009   PALPITATIONS, RECURRENT 07/07/2009   ADVERSE REACTION TO MEDICATION 07/07/2009   OTHER ACUTE REACTIONS TO STRESS 03/30/2009   NECK PAIN 03/30/2009   RLQ PAIN 03/30/2009   OTITIS MEDIA, SEROUS, ACUTE, LEFT 12/21/2008   Sinusitis, chronic 12/21/2008   FEVER UNSPECIFIED 12/04/2008   COUGH 12/04/2008   KNEE PAIN, RIGHT 11/12/2008   Vitamin D deficiency 08/12/2008   Attention deficit disorder 08/12/2008   Acute upper respiratory infection 08/12/2008   POLYCYSTIC OVARIAN DISEASE 04/16/2008   Severe obesity (BMI >= 40) (HCC) 04/16/2008   TOBACCO USE 04/16/2008   ADJUSTMENT DISORDER WITH DEPRESSED MOOD 04/16/2008   Endometriosis 04/16/2008   SYNCOPE  04/16/2008   FATIGUE 04/16/2008   ABNORMAL WEIGHT GAIN 04/16/2008   Allergic rhinitis 04/11/2007   GERD 04/11/2007   Headache 04/11/2007    Past Surgical History:  Procedure Laterality Date   CHOLECYSTECTOMY  1994   TONSILLECTOMY  1967   TOTAL ABDOMINAL HYSTERECTOMY W/ BILATERAL SALPINGOOPHORECTOMY  07/15/14   has ovaries     OB History     Gravida  2   Para  2   Term  2   Preterm      AB      Living         SAB      IAB      Ectopic      Multiple      Live Births               Home Medications    Prior to Admission medications   Medication Sig Start Date End Date Taking? Authorizing Provider  naproxen (NAPROSYN) 500 MG tablet Take 1 tablet (500 mg total) by mouth 2 (two) times daily as needed. 09/22/23  Yes Particia Nearing, PA-C  tiZANidine (ZANAFLEX) 4 MG capsule Take 1 capsule (4 mg total) by mouth 3 (three) times daily as needed for muscle spasms. Do not drink alcohol or drive while taking this medication.  May cause drowsiness 09/22/23  Yes Particia Nearing, PA-C  ALPRAZolam Prudy Feeler) 1 MG tablet Take 1 mg by mouth as needed for anxiety.    [provider]  escitalopram (LEXAPRO) 20  MG tablet Take by mouth. 01/05/20   [provider]  lisinopril-hydrochlorothiazide (ZESTORETIC) 20-25 MG tablet Take 1 tablet by mouth daily.    [provider]  omeprazole (PRILOSEC) 20 MG capsule Take 20 mg by mouth daily.    [provider]  rosuvastatin (CRESTOR) 5 MG tablet Take 5 mg by mouth daily.    [provider]    Family History Family History  Adopted: Yes    Social History Social History   Tobacco Use   Smoking status: Every Day   Smokeless tobacco: Never  Substance Use Topics   Alcohol use: No    Alcohol/week: 0.0 standard drinks of alcohol   Drug use: No     Allergies   Alprazolam and Erythromycin   Review of Systems Review of Systems Per HPI  Physical Exam Triage Vital  Signs ED Triage Vitals  Encounter Vitals Group     BP 09/22/23 1403 123/84     Systolic BP Percentile --      Diastolic BP Percentile --      Pulse Rate 09/22/23 1403 84     Resp 09/22/23 1403 20     Temp 09/22/23 1403 98.2 F (36.8 C)     Temp Source 09/22/23 1403 Oral     SpO2 09/22/23 1403 94 %     Weight --      Height --      Head Circumference --      Peak Flow --      Pain Score 09/22/23 1401 6     Pain Loc --      Pain Education --      Exclude from Growth Chart --    No data found.  Updated Vital Signs BP 123/84 (BP Location: Right Arm)   Pulse 84   Temp 98.2 F (36.8 C) (Oral)   Resp 20   SpO2 94%   Visual Acuity Right Eye Distance:   Left Eye Distance:   Bilateral Distance:    Right Eye Near:   Left Eye Near:    Bilateral Near:     Physical Exam Vitals and nursing note reviewed.  Constitutional:      Appearance: Normal appearance. She is not ill-appearing.  HENT:     Head: Atraumatic.  Eyes:     Extraocular Movements: Extraocular movements intact.     Conjunctiva/sclera: Conjunctivae normal.  Cardiovascular:     Rate and Rhythm: Normal rate and regular rhythm.     Heart sounds: Normal heart sounds.  Pulmonary:     Effort: Pulmonary effort is normal.     Breath sounds: Normal breath sounds.  Musculoskeletal:        General: Tenderness and signs of injury present. No swelling or deformity. Normal range of motion.     Cervical back: Normal range of motion and neck supple.     Comments: Tender to palpation bilateral anterior knees, right lateral hip with no point tenderness, deformities or instability to any of the joints.  Ambulatory without difficulty, strength full and equal bilateral lower extremities.  Skin:    General: Skin is warm and dry.     Findings: No bruising or erythema.  Neurological:     Mental Status: She is alert and oriented to person, place, and time.     Motor: No weakness.     Gait: Gait normal.     Comments: All 4  extremities neurovascularly intact  Psychiatric:        Mood and  Affect: Mood normal.        Thought Content: Thought content normal.        Judgment: Judgment normal.      UC Treatments / Results  Labs (all labs ordered are listed, but only abnormal results are displayed) Labs Reviewed - No data to display  EKG   Radiology No results found.  Procedures Procedures (including critical care time)  Medications Ordered in UC Medications - No data to display  Initial Impression / Assessment and Plan / UC Course  I have reviewed the triage vital signs and the nursing notes.  Pertinent labs & imaging results that were available during my care of the patient were reviewed by me and considered in my medical decision making (see chart for details).     X-ray imaging deferred with shared decision making today.  Suspect muscular pain and contusions.  Will treat with Zanaflex, naproxen, heat, massage, stretches.  Return for any worsening symptoms.  Final Clinical Impressions(s) / UC Diagnoses   Final diagnoses:  Right leg pain  Pain and swelling of knee, left  Contusion of right hip, initial encounter  Fall, initial encounter     Discharge Instructions      Your exam is very reassuring today.  You may ice, elevate, rest and I have sent over anti-inflammatory pain medication and muscle relaxers.  Follow-up for significantly worsening symptoms.    ED Prescriptions     Medication Sig Dispense Auth. Provider   tiZANidine (ZANAFLEX) 4 MG capsule Take 1 capsule (4 mg total) by mouth 3 (three) times daily as needed for muscle spasms. Do not drink alcohol or drive while taking this medication.  May cause drowsiness 15 capsule Particia Nearing, [place]   naproxen (NAPROSYN) 500 MG tablet Take 1 tablet (500 mg total) by mouth 2 (two) times daily as needed. 20 tablet Particia Nearing, [place]      PDMP not reviewed this encounter.   Particia Nearing, [place] 09/26/23  1328

## 2023-09-29 ENCOUNTER — Other Ambulatory Visit: Payer: Self-pay | Admitting: Family Medicine

## 2023-10-06 ENCOUNTER — Other Ambulatory Visit: Payer: Self-pay | Admitting: Family Medicine

## 2023-10-13 ENCOUNTER — Other Ambulatory Visit: Payer: Self-pay | Admitting: Family Medicine

## 2024-03-17 ENCOUNTER — Other Ambulatory Visit (HOSPITAL_COMMUNITY): Payer: Self-pay | Admitting: Family Medicine

## 2024-03-17 DIAGNOSIS — E782 Mixed hyperlipidemia: Secondary | ICD-10-CM

## 2024-03-27 ENCOUNTER — Ambulatory Visit (HOSPITAL_COMMUNITY)
Admission: RE | Admit: 2024-03-27 | Discharge: 2024-03-27 | Disposition: A | Discharge status: Home / Self Care | Source: Ambulatory Visit | Attending: Family Medicine | Admitting: Family Medicine

## 2024-03-27 ENCOUNTER — Encounter (HOSPITAL_COMMUNITY): Payer: Self-pay

## 2024-03-27 DIAGNOSIS — Z1231 Encounter for screening mammogram for malignant neoplasm of breast: Secondary | ICD-10-CM | POA: Diagnosis present

## 2024-04-25 ENCOUNTER — Other Ambulatory Visit: Payer: Self-pay | Admitting: Orthopedic Surgery

## 2024-05-23 ENCOUNTER — Other Ambulatory Visit (HOSPITAL_COMMUNITY)

## 2024-05-28 ENCOUNTER — Encounter (HOSPITAL_BASED_OUTPATIENT_CLINIC_OR_DEPARTMENT_OTHER): Payer: Self-pay | Admitting: Orthopedic Surgery

## 2024-06-03 ENCOUNTER — Other Ambulatory Visit: Payer: Self-pay | Admitting: Orthopedic Surgery

## 2024-06-03 ENCOUNTER — Encounter (HOSPITAL_BASED_OUTPATIENT_CLINIC_OR_DEPARTMENT_OTHER)
Admission: RE | Admit: 2024-06-03 | Discharge: 2024-06-03 | Disposition: A | Discharge status: Home / Self Care | Source: Ambulatory Visit | Attending: Orthopedic Surgery | Admitting: Orthopedic Surgery

## 2024-06-03 DIAGNOSIS — Z01818 Encounter for other preprocedural examination: Secondary | ICD-10-CM | POA: Insufficient documentation

## 2024-06-03 DIAGNOSIS — D2111 Benign neoplasm of connective and other soft tissue of right upper limb, including shoulder: Secondary | ICD-10-CM | POA: Diagnosis not present

## 2024-06-03 DIAGNOSIS — I10 Essential (primary) hypertension: Secondary | ICD-10-CM | POA: Diagnosis not present

## 2024-06-03 DIAGNOSIS — M189 Osteoarthritis of first carpometacarpal joint, unspecified: Secondary | ICD-10-CM | POA: Diagnosis not present

## 2024-06-03 DIAGNOSIS — K219 Gastro-esophageal reflux disease without esophagitis: Secondary | ICD-10-CM | POA: Diagnosis not present

## 2024-06-03 LAB — BASIC METABOLIC PANEL WITH GFR
Anion gap: 10 (ref 5–15)
BUN: 13 mg/dL (ref 6–20)
CO2: 27 mmol/L (ref 22–32)
Calcium: 9.3 mg/dL (ref 8.9–10.3)
Chloride: 101 mmol/L (ref 98–111)
Creatinine, Ser: 0.83 mg/dL (ref 0.44–1.00)
GFR, Estimated: >60 mL/min (ref >60)
Glucose, Bld: 113 mg/dL — ABNORMAL HIGH (ref 70–99)
Potassium: 4.1 mmol/L (ref 3.5–5.1)
Sodium: 138 mmol/L (ref 135–145)

## 2024-06-03 NOTE — Progress Notes (Signed)

## 2024-06-05 ENCOUNTER — Encounter (HOSPITAL_BASED_OUTPATIENT_CLINIC_OR_DEPARTMENT_OTHER): Payer: Self-pay | Admitting: Orthopedic Surgery

## 2024-06-05 ENCOUNTER — Other Ambulatory Visit: Payer: Self-pay

## 2024-06-05 ENCOUNTER — Ambulatory Visit (HOSPITAL_BASED_OUTPATIENT_CLINIC_OR_DEPARTMENT_OTHER): Admitting: Certified Registered Nurse Anesthetist

## 2024-06-05 ENCOUNTER — Ambulatory Visit (HOSPITAL_BASED_OUTPATIENT_CLINIC_OR_DEPARTMENT_OTHER)
Admission: RE | Admit: 2024-06-05 | Discharge: 2024-06-05 | Disposition: A | Discharge status: Home / Self Care | Attending: Orthopedic Surgery | Admitting: Orthopedic Surgery

## 2024-06-05 ENCOUNTER — Encounter (HOSPITAL_BASED_OUTPATIENT_CLINIC_OR_DEPARTMENT_OTHER)
Admission: RE | Disposition: A | Discharge status: Home / Self Care | Payer: Self-pay | Source: Home / Self Care | Attending: Orthopedic Surgery

## 2024-06-05 DIAGNOSIS — I10 Essential (primary) hypertension: Secondary | ICD-10-CM

## 2024-06-05 DIAGNOSIS — M71341 Other bursal cyst, right hand: Secondary | ICD-10-CM | POA: Diagnosis not present

## 2024-06-05 DIAGNOSIS — F419 Anxiety disorder, unspecified: Secondary | ICD-10-CM | POA: Diagnosis not present

## 2024-06-05 DIAGNOSIS — F17200 Nicotine dependence, unspecified, uncomplicated: Secondary | ICD-10-CM | POA: Diagnosis not present

## 2024-06-05 DIAGNOSIS — D2111 Benign neoplasm of connective and other soft tissue of right upper limb, including shoulder: Secondary | ICD-10-CM | POA: Diagnosis not present

## 2024-06-05 DIAGNOSIS — K219 Gastro-esophageal reflux disease without esophagitis: Secondary | ICD-10-CM | POA: Insufficient documentation

## 2024-06-05 DIAGNOSIS — M189 Osteoarthritis of first carpometacarpal joint, unspecified: Secondary | ICD-10-CM | POA: Insufficient documentation

## 2024-06-05 DIAGNOSIS — M67441 Ganglion, right hand: Secondary | ICD-10-CM | POA: Diagnosis present

## 2024-06-05 DIAGNOSIS — Z01818 Encounter for other preprocedural examination: Secondary | ICD-10-CM

## 2024-06-05 DIAGNOSIS — Z79899 Other long term (current) drug therapy: Secondary | ICD-10-CM | POA: Diagnosis not present

## 2024-06-05 HISTORY — DX: Essential (primary) hypertension: I10

## 2024-06-05 HISTORY — PX: CYST REMOVAL HAND: SHX6279

## 2024-06-05 SURGERY — REMOVAL, CYST, HAND
Anesthesia: Monitor Anesthesia Care | Site: Thumb | Laterality: Right

## 2024-06-05 MED ORDER — OXYCODONE HCL 5 MG PO TABS: 5.0000 mg | ORAL_TABLET | Freq: Once | ORAL | Status: DC | PRN

## 2024-06-05 MED ORDER — OXYCODONE HCL 5 MG/5ML PO SOLN: 5.0000 mg | Freq: Once | ORAL | Status: DC | PRN

## 2024-06-05 MED ORDER — BUPIVACAINE HCL (PF) 0.25 % IJ SOLN
INTRAMUSCULAR | Status: DC | PRN
Start: 2024-06-05 — End: 2024-06-05
  Administered 2024-06-05: 9 mL

## 2024-06-05 MED ORDER — FENTANYL CITRATE (PF) 100 MCG/2ML IJ SOLN: 25.0000 ug | INTRAMUSCULAR | Status: DC | PRN

## 2024-06-05 MED ORDER — DEXAMETHASONE SOD PHOSPHATE PF 10 MG/ML IJ SOLN
INTRAMUSCULAR | Status: DC | PRN
Start: 2024-06-05 — End: 2024-06-05
  Administered 2024-06-05: 4 mg via INTRAVENOUS

## 2024-06-05 MED ORDER — ACETAMINOPHEN 10 MG/ML IV SOLN: 1000.0000 mg | Freq: Once | INTRAVENOUS | Status: DC | PRN

## 2024-06-05 MED ORDER — LACTATED RINGERS IV SOLN: INTRAVENOUS | Status: DC

## 2024-06-05 MED ORDER — PROPOFOL 500 MG/50ML IV EMUL
INTRAVENOUS | Status: DC | PRN
  Administered 2024-06-05: 75 ug/kg/min via INTRAVENOUS

## 2024-06-05 MED ORDER — SCOPOLAMINE 1 MG/3DAYS TD PT72: 1.0000 | MEDICATED_PATCH | TRANSDERMAL | Status: DC

## 2024-06-05 MED ORDER — CEFAZOLIN SODIUM-DEXTROSE 2-4 GM/100ML-% IV SOLN
2.0000 g | INTRAVENOUS | Status: AC
  Administered 2024-06-05: 2 g via INTRAVENOUS

## 2024-06-05 MED ORDER — MIDAZOLAM HCL 2 MG/2ML IJ SOLN
INTRAMUSCULAR | Status: AC
Start: 2024-06-05 — End: 2024-06-05
  Filled 2024-06-05: qty 2

## 2024-06-05 MED ORDER — MIDAZOLAM HCL (PF) 2 MG/2ML IJ SOLN
INTRAMUSCULAR | Status: DC | PRN
Start: 2024-06-05 — End: 2024-06-05
  Administered 2024-06-05: 2 mg via INTRAVENOUS

## 2024-06-05 MED ORDER — DROPERIDOL 2.5 MG/ML IJ SOLN
0.6250 mg | Freq: Once | INTRAMUSCULAR | Status: DC | PRN
Start: 2024-06-05 — End: 2024-06-05

## 2024-06-05 MED ORDER — ONDANSETRON HCL 4 MG/2ML IJ SOLN
INTRAMUSCULAR | Status: DC | PRN
  Administered 2024-06-05: 4 mg via INTRAVENOUS

## 2024-06-05 MED ORDER — DEXMEDETOMIDINE HCL IN NACL 80 MCG/20ML IV SOLN
INTRAVENOUS | Status: AC
Start: 2024-06-05 — End: 2024-06-05
  Filled 2024-06-05: qty 20

## 2024-06-05 MED ORDER — CEFAZOLIN SODIUM-DEXTROSE 2-4 GM/100ML-% IV SOLN
INTRAVENOUS | Status: AC
Start: 2024-06-05 — End: 2024-06-05
  Filled 2024-06-05: qty 100

## 2024-06-05 MED ORDER — HYDROCODONE-ACETAMINOPHEN 5-325 MG PO TABS: 1.0000 | ORAL_TABLET | Freq: Four times a day (QID) | ORAL | 0 refills | Status: AC | PRN

## 2024-06-05 MED ORDER — PROPOFOL 10 MG/ML IV BOLUS
INTRAVENOUS | Status: DC | PRN
  Administered 2024-06-05: 20 mg via INTRAVENOUS
  Administered 2024-06-05: 30 mg via INTRAVENOUS

## 2024-06-05 MED ORDER — FENTANYL CITRATE (PF) 250 MCG/5ML IJ SOLN
INTRAMUSCULAR | Status: DC | PRN
  Administered 2024-06-05: 50 ug via INTRAVENOUS

## 2024-06-05 MED ORDER — FENTANYL CITRATE (PF) 100 MCG/2ML IJ SOLN
INTRAMUSCULAR | Status: AC
  Filled 2024-06-05: qty 2

## 2024-06-05 MED ORDER — DEXMEDETOMIDINE HCL IN NACL 80 MCG/20ML IV SOLN
INTRAVENOUS | Status: DC | PRN
  Administered 2024-06-05 (×3): 4 ug via INTRAVENOUS

## 2024-06-05 SURGICAL SUPPLY — 39 items
BANDAGE GAUZE 1X75IN STRL (MISCELLANEOUS) IMPLANT
BENZOIN TINCTURE PRP APPL 2/3 (GAUZE/BANDAGES/DRESSINGS) IMPLANT
BLADE MINI RND TIP GREEN BEAV (BLADE) IMPLANT
BLADE SURG 15 STRL LF DISP TIS (BLADE) ×4 IMPLANT
BNDG COHESIVE 1X5 TAN STRL LF (GAUZE/BANDAGES/DRESSINGS) IMPLANT
BNDG COHESIVE 2X5 TAN ST LF (GAUZE/BANDAGES/DRESSINGS) IMPLANT
BNDG COMPR ESMARK 4X3 LF (GAUZE/BANDAGES/DRESSINGS) IMPLANT
BNDG ELASTIC 2INX 5YD STR LF (GAUZE/BANDAGES/DRESSINGS) IMPLANT
BNDG ELASTIC 3INX 5YD STR LF (GAUZE/BANDAGES/DRESSINGS) IMPLANT
BNDG GAUZE DERMACEA FLUFF 4 (GAUZE/BANDAGES/DRESSINGS) IMPLANT
BNDG PLASTER X FAST 3X3 WHT LF (CAST SUPPLIES) IMPLANT
CHLORAPREP W/TINT 26 (MISCELLANEOUS) ×2 IMPLANT
CORD BIPOLAR FORCEPS 12FT (ELECTRODE) ×2 IMPLANT
COVER BACK TABLE 60X90IN (DRAPES) ×2 IMPLANT
COVER MAYO STAND STRL (DRAPES) ×2 IMPLANT
CUFF TOURN SGL QUICK 18X4 (TOURNIQUET CUFF) ×2 IMPLANT
DRAPE EXTREMITY T 121X128X90 (DISPOSABLE) ×2 IMPLANT
DRAPE SURG 17X23 STRL (DRAPES) ×2 IMPLANT
GAUZE SPONGE 4X4 12PLY STRL (GAUZE/BANDAGES/DRESSINGS) ×2 IMPLANT
GAUZE STRETCH 2X75IN STRL (MISCELLANEOUS) IMPLANT
GAUZE XEROFORM 1X8 LF (GAUZE/BANDAGES/DRESSINGS) ×2 IMPLANT
GLOVE BIO SURGEON STRL SZ7.5 (GLOVE) ×2 IMPLANT
GLOVE BIOGEL PI IND STRL 8 (GLOVE) ×2 IMPLANT
GOWN STRL REUS W/ TWL LRG LVL3 (GOWN DISPOSABLE) ×2 IMPLANT
NDL HYPO 25X1 1.5 SAFETY (NEEDLE) ×2 IMPLANT
NEEDLE HYPO 25X1 1.5 SAFETY (NEEDLE) ×1 IMPLANT
PACK BASIN DAY SURGERY FS (CUSTOM PROCEDURE TRAY) ×2 IMPLANT
PAD CAST 3X4 CTTN HI CHSV (CAST SUPPLIES) IMPLANT
PAD CAST 4YDX4 CTTN HI CHSV (CAST SUPPLIES) IMPLANT
PADDING CAST ABS COTTON 4X4 ST (CAST SUPPLIES) ×2 IMPLANT
SOLN 0.9% NACL POUR BTL 1000ML (IV SOLUTION) ×2 IMPLANT
STOCKINETTE 4X48 STRL (DRAPES) ×2 IMPLANT
STRIP CLOSURE SKIN 1/2X4 (GAUZE/BANDAGES/DRESSINGS) IMPLANT
SUT ETHILON 3 0 PS 1 (SUTURE) IMPLANT
SUT ETHILON 4 0 PS 2 18 (SUTURE) ×2 IMPLANT
SYR BULB EAR ULCER 3OZ GRN STR (SYRINGE) ×2 IMPLANT
SYR CONTROL 10ML LL (SYRINGE) ×2 IMPLANT
TOWEL GREEN STERILE FF (TOWEL DISPOSABLE) ×4 IMPLANT
UNDERPAD 30X36 HEAVY ABSORB (UNDERPADS AND DIAPERS) ×2 IMPLANT

## 2024-06-05 NOTE — Anesthesia Postprocedure Evaluation (Signed)
 Anesthesia Post Note  Patient: Joy Cooley  Procedure(s) Performed: REMOVAL, CYST, HAND (Right: Thumb)     Patient location during evaluation: PACU Anesthesia Type: MAC Level of consciousness: awake and alert Pain management: pain level controlled Vital Signs Assessment: post-procedure vital signs reviewed and stable Respiratory status: spontaneous breathing, nonlabored ventilation, respiratory function stable and patient connected to nasal cannula oxygen Cardiovascular status: stable and blood pressure returned to baseline Postop Assessment: no apparent nausea or vomiting Anesthetic complications: no   No notable events documented.  Last Vitals:  Vitals:   06/05/24 0911 06/05/24 0922  BP: 97/64 95/62  Pulse: 70 69  Resp: 16 20  Temp:  36.5 C  SpO2: 94% 95%    Last Pain:  Vitals:   06/05/24 0922  TempSrc: Temporal  PainSc: 0-No pain                 Franky JONETTA Bald

## 2024-06-05 NOTE — Transfer of Care (Signed)
 Immediate Anesthesia Transfer of Care Note  Patient: Joy Cooley  Procedure(s) Performed: REMOVAL, CYST, HAND (Right: Thumb)  Patient Location: PACU  Anesthesia Type:MAC  Level of Consciousness: awake, alert , and oriented  Airway & Oxygen Therapy: Patient Spontanous Breathing  Post-op Assessment: Report given to RN and Post -op Vital signs reviewed and stable  Post vital signs: Reviewed and stable  Last Vitals:  Vitals Value Taken Time  BP 91/65 06/05/24 09:00  Temp    Pulse 73 06/05/24 09:03  Resp 20 06/05/24 09:03  SpO2 94 % 06/05/24 09:03  Vitals shown include unfiled device data.  Last Pain:  Vitals:   06/05/24 0659  TempSrc: Tympanic  PainSc: 0-No pain      Patients Stated Pain Goal: 4 (06/05/24 0659)  Complications: No notable events documented.

## 2024-06-05 NOTE — Op Note (Signed)
 NAME: Joy Cooley MEDICAL RECORD NO: [id number] DATE OF BIRTH: 03/11/1966 FACILITY: [facility] LOCATION: [facility] PHYSICIAN: Raunel Dimartino R. 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   ASSISTANT: none   ANESTHESIA:  Local with sedation   INTRAVENOUS FLUIDS:  Per anesthesia flow sheet.   ESTIMATED BLOOD LOSS:  Minimal.   COMPLICATIONS:  None.   SPECIMENS: Right thumb mass to pathology   TOURNIQUET TIME: Right arm: 12 minutes at 250 mmHg   DISPOSITION:  Stable to PACU.   INDICATIONS: 58 year old female with right thumb mass.  She wished to proceed with excision of mucoid cyst and debridement of IP joint to try to prevent recurrence.  Risks, benefits and alternatives of surgery were discussed including the risks of blood loss, infection, damage to nerves, vessels, tendons, ligaments, bone for surgery, need for additional surgery, complications with wound healing, continued pain, stiffness, , recurrence.  She voiced understanding of these risks and elected to proceed.  OPERATIVE COURSE:  After being identified preoperatively by myself,  the patient and I agreed on the procedure and site of the procedure.  The surgical site was marked.  Surgical consent had been signed. Preoperative IV antibiotic prophylaxis was given. She was transferred to the operating room and placed on the operating table in supine position with the right upper extremity on an arm board.  Sedation was induced by the anesthesiologist.  A surgical pause was performed between the surgeons anesthesia and operating room staff and all are in agreement with the patient procedure and site procedure.  A digital block was performed with quarter percent plain Marcaine.  Right upper extremity was  prepped and draped in normal sterile orthopedic fashion.  A surgical pause was performed between the surgeons, anesthesia, and operating room staff and all were in agreement as to the patient, procedure, and site of procedure.  Tourniquet at the proximal aspect of the extremity was inflated to 250 mmHg after exsanguination of the arm with an Esmarch bandage.  A hockey-stick shaped incision was made over the IP joint of the thumb.  This was carried in subcutaneous tissues by spreading technique.  The mass was located.  It was more solid than cystic.  It was freed up from surrounding soft tissues.  It was removed in its entirety.  Bipolar electrocautery was used to obtain hemostasis.  The mass was 7 mm in diameter.  It was sent to pathology for examination.  Given that this was a solid mass the IP joint was not entered for debridement.  The wound was irrigated with sterile saline.  It was closed with 4-0 nylon in a horizontal mattress fashion.  It was then dressed with sterile Xeroform and 4 x 4 and wrapped with a Coban dressing lightly.  AlumaFoam splint was placed and wrapped lightly with Coban dressing.  The tourniquet was deflated at 12 minutes.  Fingertips were pink with brisk capillary refill after deflation of tourniquet.  The operative  drapes were broken down.  The patient was awoken from anesthesia safely.  She was transferred back to the stretcher and taken to PACU in stable condition.  I will see her back in the office in 1 week for postoperative followup.  I will give her a prescription for Norco  5/325 1 tab PO q6 hours prn pain, dispense #15.   Laneka Mcgrory, MD Electronically signed, 06/05/24

## 2024-06-05 NOTE — H&P (Signed)
 Joy Cooley is an 58 y.o. female.   Chief Complaint: mucoid cyst HPI: 58 yo female with right thumb mucoid cyst.  This is bothersome to her.  She wishes to have it removed.  Allergies: No Known Allergies  Past Medical History:  Diagnosis Date   Blood transfusion without reported diagnosis    from hysterectomy surgery   GERD (gastroesophageal reflux disease)    Hx of varicella    Hx: UTI (urinary tract infection)    Hypertension    Recurrent headache    neck can trigger this has done pt /flexeril     Past Surgical History:  Procedure Laterality Date   CHOLECYSTECTOMY  1994   TONSILLECTOMY  1967   TOTAL ABDOMINAL HYSTERECTOMY W/ BILATERAL SALPINGOOPHORECTOMY  07/15/14   has ovaries     Family History: Family History  Adopted: Yes    Social History:   reports that she has never smoked. She has never used smokeless tobacco. She reports current alcohol use. She reports that she does not use drugs.  Medications: Medications Prior to Admission  Medication Sig Dispense Refill   ALPRAZolam (XANAX) 1 MG tablet Take 1 mg by mouth as needed for anxiety.     buPROPion (WELLBUTRIN XL) 300 MG 24 hr tablet Take 300 mg by mouth daily.     lisinopril -hydrochlorothiazide  (ZESTORETIC ) 20-25 MG tablet Take 1 tablet by mouth daily.     naproxen  (NAPROSYN ) 500 MG tablet Take 1 tablet (500 mg total) by mouth 2 (two) times daily as needed. 20 tablet 0   omeprazole (PRILOSEC) 20 MG capsule Take 20 mg by mouth daily.     rosuvastatin (CRESTOR) 5 MG tablet Take 5 mg by mouth daily.      Results for orders placed or performed during the hospital encounter of 06/05/24 (from the past 48 hours)  Basic metabolic panel per protocol     Status: Abnormal   Collection Time: 06/03/24  1:14 PM  Result Value Ref Range   Sodium 138 135 - 145 mmol/L   Potassium 4.1 3.5 - 5.1 mmol/L   Chloride 101 98 - 111 mmol/L   CO2 27 22 - 32 mmol/L   Glucose, Bld 113 (H) 70 - 99 mg/dL    Comment: Glucose  reference range applies only to samples taken after fasting for at least 8 hours.   BUN 13 6 - 20 mg/dL   Creatinine, Ser 9.16 0.44 - 1.00 mg/dL   Calcium 9.3 8.9 - 89.6 mg/dL   GFR, Estimated >39 >39 mL/min    Comment: (NOTE) Calculated using the CKD-EPI Creatinine Equation (2021)    Anion gap 10 5 - 15    Comment: Performed at [facility], [address], [place], [place] [address]    No results found.    Blood pressure 103/63, pulse 86, temperature (!) 97.5 F (36.4 C), temperature source Tympanic, resp. rate 16, height 5' 9 (1.753 m), weight 122.2 kg, SpO2 98%.  General appearance: alert, cooperative, and appears stated age Head: Normocephalic, without obvious abnormality, atraumatic Neck: supple, symmetrical, trachea midline Extremities: Intact sensation and capillary refill all digits.  +epl/fpl/io.  No wounds.  Skin: Skin color, texture, turgor normal. No rashes or lesions Neurologic: Grossly normal Incision/Wound: none  Assessment/Plan Right thumb mucoid cyst.  Non operative and operative treatment options have been discussed with the patient and patient wishes to proceed with operative treatment. Risks, benefits, and alternatives of surgery have been discussed and the patient agrees with the plan of  care.   Franky Curia 06/05/2024, 8:09 AM

## 2024-06-05 NOTE — Discharge Instructions (Addendum)

## 2024-06-05 NOTE — Anesthesia Preprocedure Evaluation (Addendum)
 Anesthesia Evaluation  Patient identified by MRN, date of birth, ID band Patient awake    Reviewed: Allergy & Precautions, NPO status , Patient's Chart, lab work & pertinent test results  Airway Mallampati: II  TM Distance: >3 FB Neck ROM: Full    Dental  (+) Teeth Intact, Dental Advisory Given   Pulmonary neg pulmonary ROS   breath sounds clear to auscultation       Cardiovascular hypertension, Pt. on medications  Rhythm:Regular Rate:Normal     Neuro/Psych  Headaches PSYCHIATRIC DISORDERS Anxiety        GI/Hepatic Neg liver ROS,GERD  ,,  Endo/Other  negative endocrine ROS    Renal/GU negative Renal ROS     Musculoskeletal negative musculoskeletal ROS (+)    Abdominal   Peds  Hematology  (+) Blood dyscrasia, anemia   Anesthesia Other Findings   Reproductive/Obstetrics                              Anesthesia Physical Anesthesia Plan  ASA: 3  Anesthesia Plan: MAC   Post-op Pain Management: Tylenol PO (pre-op)*   Induction: Intravenous  PONV Risk Score and Plan: 4 or greater and Ondansetron, Dexamethasone , Midazolam and Scopolamine patch - Pre-op  Airway Management Planned: Natural Airway and Nasal Cannula  Additional Equipment: None  Intra-op Plan:   Post-operative Plan:   Informed Consent: I have reviewed the patients History and Physical, chart, labs and discussed the procedure including the risks, benefits and alternatives for the proposed anesthesia with the patient or authorized representative who has indicated his/her understanding and acceptance.       Plan Discussed with: CRNA  Anesthesia Plan Comments: (Consented for GA w/ LMA if surgeon prefers)         Anesthesia Quick Evaluation

## 2024-06-06 ENCOUNTER — Encounter (HOSPITAL_BASED_OUTPATIENT_CLINIC_OR_DEPARTMENT_OTHER): Payer: Self-pay | Admitting: Orthopedic Surgery

## 2024-06-09 LAB — SURGICAL PATHOLOGY
